# Patient Record
Sex: Male | Born: 1955
Health system: Southern US, Community
[De-identification: ages and names within clinical notes are randomized; demographics above are authoritative.]

## PROBLEM LIST (undated history)

## (undated) DIAGNOSIS — M1990 Unspecified osteoarthritis, unspecified site: Secondary | ICD-10-CM

## (undated) DIAGNOSIS — J189 Pneumonia, unspecified organism: Secondary | ICD-10-CM

## (undated) DIAGNOSIS — F32A Depression, unspecified: Secondary | ICD-10-CM

## (undated) DIAGNOSIS — I739 Peripheral vascular disease, unspecified: Secondary | ICD-10-CM

## (undated) DIAGNOSIS — C801 Malignant (primary) neoplasm, unspecified: Secondary | ICD-10-CM

## (undated) DIAGNOSIS — J449 Chronic obstructive pulmonary disease, unspecified: Secondary | ICD-10-CM

## (undated) DIAGNOSIS — F329 Major depressive disorder, single episode, unspecified: Secondary | ICD-10-CM

## (undated) HISTORY — PX: KNEE SURGERY: SHX244

## (undated) HISTORY — PX: TONSILLECTOMY: SUR1361

---
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
## 2000-02-05 ENCOUNTER — Ambulatory Visit (HOSPITAL_BASED_OUTPATIENT_CLINIC_OR_DEPARTMENT_OTHER): Admission: RE | Admit: 2000-02-05 | Discharge: 2000-02-05 | Payer: Self-pay | Admitting: Orthopedic Surgery

## 2004-12-17 ENCOUNTER — Ambulatory Visit (HOSPITAL_COMMUNITY): Admission: RE | Admit: 2004-12-17 | Discharge: 2004-12-17 | Payer: Self-pay | Admitting: Family Medicine

## 2006-04-29 ENCOUNTER — Emergency Department (HOSPITAL_COMMUNITY): Admission: EM | Admit: 2006-04-29 | Discharge: 2006-04-30 | Payer: Self-pay | Admitting: Emergency Medicine

## 2007-03-29 ENCOUNTER — Ambulatory Visit: Payer: Self-pay | Admitting: Orthopedic Surgery

## 2007-03-30 ENCOUNTER — Encounter (HOSPITAL_COMMUNITY): Admission: RE | Admit: 2007-03-30 | Discharge: 2007-04-29 | Payer: Self-pay | Admitting: Orthopedic Surgery

## 2007-03-30 ENCOUNTER — Encounter: Payer: Self-pay | Admitting: Orthopedic Surgery

## 2007-05-27 ENCOUNTER — Ambulatory Visit: Payer: Self-pay | Admitting: Orthopedic Surgery

## 2008-08-14 ENCOUNTER — Encounter: Payer: Self-pay | Admitting: Orthopedic Surgery

## 2008-08-14 ENCOUNTER — Ambulatory Visit (HOSPITAL_COMMUNITY): Admission: RE | Admit: 2008-08-14 | Discharge: 2008-08-14 | Payer: Self-pay | Admitting: General Surgery

## 2008-08-23 ENCOUNTER — Encounter (INDEPENDENT_AMBULATORY_CARE_PROVIDER_SITE_OTHER): Payer: Self-pay | Admitting: *Deleted

## 2008-08-23 ENCOUNTER — Ambulatory Visit: Payer: Self-pay | Admitting: Orthopedic Surgery

## 2008-08-23 DIAGNOSIS — M23302 Other meniscus derangements, unspecified lateral meniscus, unspecified knee: Secondary | ICD-10-CM | POA: Insufficient documentation

## 2008-08-24 ENCOUNTER — Encounter: Payer: Self-pay | Admitting: Orthopedic Surgery

## 2008-08-25 ENCOUNTER — Ambulatory Visit: Payer: Self-pay | Admitting: Orthopedic Surgery

## 2008-08-25 ENCOUNTER — Ambulatory Visit (HOSPITAL_COMMUNITY): Admission: RE | Admit: 2008-08-25 | Discharge: 2008-08-25 | Payer: Self-pay | Admitting: Orthopedic Surgery

## 2008-08-29 ENCOUNTER — Ambulatory Visit: Payer: Self-pay | Admitting: Orthopedic Surgery

## 2008-09-07 ENCOUNTER — Ambulatory Visit: Payer: Self-pay | Admitting: Orthopedic Surgery

## 2008-09-14 ENCOUNTER — Ambulatory Visit: Payer: Self-pay | Admitting: Orthopedic Surgery

## 2008-10-12 ENCOUNTER — Ambulatory Visit: Payer: Self-pay | Admitting: Orthopedic Surgery

## 2008-10-24 ENCOUNTER — Ambulatory Visit: Payer: Self-pay | Admitting: Orthopedic Surgery

## 2008-10-24 DIAGNOSIS — M1710 Unilateral primary osteoarthritis, unspecified knee: Secondary | ICD-10-CM

## 2008-10-24 DIAGNOSIS — M2240 Chondromalacia patellae, unspecified knee: Secondary | ICD-10-CM | POA: Insufficient documentation

## 2008-10-24 DIAGNOSIS — IMO0002 Reserved for concepts with insufficient information to code with codable children: Secondary | ICD-10-CM | POA: Insufficient documentation

## 2008-11-27 ENCOUNTER — Ambulatory Visit: Payer: Self-pay | Admitting: Orthopedic Surgery

## 2010-02-14 NOTE — Letter (Signed)
Summary: Generic Letter  [facility]  [address]  [place], [place] [address]   Phone: [phone]  Fax: [phone]    08/24/2008  Kenard Gower [address] [place], [place]  [address]  Visit Type:  Follow-up, new problem   CC:  left knee pain.  History of Present Illness: I saw Nettie Fredricksen in the office today for a followup visit.  He is a 55 years old man with the complaint of:  NEW PROBLEM. LEFT KNEE PAIN.  MRI at [facility] on 08-14-08. Shows radial tear posterior horn medial meniscus. Mild patellofemoral and medial compartment osteoarthritis and leaking, dissecting Baker's cyst.  Patient states he does not know if he injured hisself. He states that it has been hurting about 8 weeks, but has gotten bad the past 2 1/2 weeks. He has had alot of swelling. He says that it has been bearable the past 2 days. He has been using Ibuprofen and ice.  He is fine as long as he keep the leg straight   Current Medications (verified): 1)  None  Allergies: 1)  ! 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  Past Medical History: Reviewed history from 03/29/2007 and no changes required. none  Past Surgical History: Reviewed history from 03/29/2007 and no changes required. tonsils scoped knee right knee 1999  Family History: Reviewed history from 03/29/2007 and no changes required. Hx, family, chronic respiratory condition  Social History: Reviewed history from 03/29/2007 and no changes required. Patient is separated.  Armed forces operational officer  Review of Systems  The  review of systems is negative for General, Cardiac , Resp, GI, GU, Neuro, MS, Endo, Psych, Derm, EENT, Immunology, and Lymphatic.  Physical Exam  Msk:  The patient is well developed and nourished, with normal grooming and hygiene. The body habitus is  medium Pulses:  The pulses and perfusion were normal with normal color, temperature  and no swelling  Extremities:  right knee normal alignment, ROM, strength, stability  left knee alignment normal ROM 0-95 medial joint line tenderness   small joint effusion, medial joint line tenderness   positive McMurrays   stability is normal    The upper extremities have normal appearance, ROM, strength and stability.   Neurologic:  The coordination and sensation were normal  The reflexes were normal   Skin:  intact without lesions or rashes Inguinal Nodes:  no significant adenopathy Psych:  alert and cooperative; normal mood and affect; normal attention span and concentration   Impression & Recommendations:  Problem # 1:  DERANGEMENT MENISCUS (ICD-717.5) Assessment New  Orders: Est. Patient Level IV (96295) Knee x-ray,  3 views (28413) normal joint lines  IMPR: normal knee   Patient Instructions: 1)  DOS 08/25/2008 2)  PREOP 08/23/2008 1215 take packet with you 3)  POSTOP 08/29/2008 4)    5)  Informed consent process: I have discussed the procedure with the patient. I have answered their questions. The risks of bleeding, infection, nerve and vascualr injury have been discussed. The diagnosis and reason for surgery have been explained. The patient demonstrates understanding of this discussion. Specific to this procedure risks include:  6)  pain,  swelling, stiffness, retear 7)  SURGERY: SALK MEDIAL MENISECTOMY     Fuller Canada MD

## 2010-02-14 NOTE — Assessment & Plan Note (Signed)
Summary: POST OP 1/LT KNEE ARTH 08/25/08/[facility]   Visit Type:  Follow-up  CC:  post surgical followup.  History of Present Illness: I saw James Harris back in the office today.  He is now post op 1 arthroscopy left knee with partial medial menisectomy.  DOS 08/25/08.  POD 4.  Vicodin es number 42, 2 refills given at discharge.  Pain med helps.  patient prefers to do on self-directed therapy due to cost.      Allergies: 1)  ! * Relfaen  Physical Exam  Additional Exam:  the knee looks good the portals were closed the flexion arc is 90 weak extension   Impression & Recommendations:  Problem # 1:  DERANGEMENT MENISCUS (ICD-717.5)  postop arthroscopy LEFT knee home directed program via pep pad  Orders: Post-Op Check (04540)  Patient Instructions: 1)  26th Aug Check knee

## 2010-02-14 NOTE — Letter (Signed)
Summary: Surgery order LT knee  Surgery order LT knee   Imported By: Cammie Sickle 08/24/2008 11:05:19  _____________________________________________________________________  External Attachment:    Type:   Image     Comment:   External Document

## 2010-02-14 NOTE — Assessment & Plan Note (Signed)
Summary: RT KNEE PAIN   Vital Signs:  Patient Profile:   55 Years Old Male Weight:      220 pounds Pulse rate:   78 / minute Resp:     16 per minute  Vitals Entered By: Ether Griffins (March 29, 2007 9:13 AM)                 Chief Complaint:  right knee pain.  History of Present Illness: I saw James Harris in the office today for an initial visit.  He is a 55 years old man with the complaint of:  chronic right knee pain.  He has been having pain for 10 years since he had a knee scope, Dr. Turner Daniels in [place] did surg for torn meniscus.  He does not have any swelling.  His pain is at knee cap on right knee. He has sharp pain and achy pain. Going up and down steps and squatting hurts him alot.  No radiation of pain.  Pain level today is around 3. He does have stiffness after sitting long periods, no recent xrays.  Ibuprofen helps somewhat.      Current Allergies: No known allergies   Past Medical History:    none  Past Surgical History:    tonsils    scoped knee right knee 1999   Family History:    Hx, family, chronic respiratory condition  Social History:    Patient is separated.     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      Denies weight loss, weight gain, fever, and chills.  Cardiac      Denies chest pain, angina, heart attack, heart failure, poor circulation, blood clots, and phlebitis.  Resp      Denies short of breath, difficulty breathing, COPD, cough, and pneumonia.  GI      Complains of constipation.      Denies nausea, vomiting, diarrhea, difficulty swallowing, ulcers, GERD, and reflux.  GU      Denies kidney failure, kidney transplant, kidney stones, burning, poor stream, testicular cancer, blood in urine, and .  Neuro      Denies headache, dizziness, migraines, numbness, weakness, tremor, and unsteady  walking.  MS      Complains of joint pain.      Denies rheumatoid arthritis, joint swelling, gout, bone cancer, osteoporosis, and .  Endo      Denies thyroid disease, goiter, and diabetes.  Psych      Denies depression, mood swings, anxiety, panic attack, bipolar, and schizophrenia.  Derm      Complains of eczema.      Denies cancer and itching.  EENT      Denies poor vision, cataracts, glaucoma, poor hearing, vertigo, ears ringing, sinusitis, hoarseness, toothaches, and bleeding gums.  Immunology      Denies seasonal allergies, sinus problems, and allergic to bee stings.  Lymphatic      Denies lymph node cancer and lymph edema.   Physical Exam  Skin:     intact without lesions or rashes Psych:     alert and cooperative; normal mood and affect; normal attention span and concentration   Knee Exam  General:    Well-developed, well-nourished, normal body habitus; no deformities, normal grooming.  Gait:    Normal heel-toe gait pattern bilaterally.  Skin:    Intact, no scars, lesions, rashes, cafe au lait spots, or bruising.    Inspection:     No deformity, ecchymosis or swelling.   Palpation:    inferior patella right knee   Vascular:    There was no swelling or varicose veins. The pulses and temperature are normal. There was no edema or tenderness.  Sensory:    Gross coordination and sensation were normal.    Motor:    Motor strength 5/5 bilaterally for quadriceps, hamstrings, ankle dorsiflexion, and ankle plantar flexion.    Reflexes:    Normal and symmetric patellar and Achilles reflexes bilaterally.    Knee Exam:    Right:    Inspection:  Abnormal    Palpation:  Abnormal    Stability:  stable    Tenderness:       Swelling:  no    Erythema:  no    Range of Motion:       Flexion-Active: full       Extension-Active: full       Flexion-Passive: full       Extension-Passive: full    Left:    Inspection:  Normal    Palpation:  Normal     Stability:  stable    Tenderness:  no    Swelling:  no    Erythema:  no    Range of Motion:       Flexion-Active: full       Extension-Active: full       Flexion-Passive: full       Extension-Passive: full  Anterior drawer:    Right negative; Left negative Posterior drawer:    Right negative; Left negative Lachman :    Right negative; Left negative  normal McMurrays test     Impression & Recommendations:  Problem # 1:  * PATELLA TENDONITIS  Multiple views of the right knee  Findings: enthesiophyte superior patella and anteroinferior patella  IMP:c/w enthesopathy  Physical Therapy Patella brace  Relafen 500 two times a day    Medications Added to Medication List This Visit: 1)  Nabumetone 500 Mg Tabs (Nabumetone) .Marland Kitchen.. 1 by mouth two times a day  Other Orders: Physical Therapy Referral (PT) New Patient Level III (04540) Knee x-ray,  3 views (98119)   Patient Instructions: 1)  Please schedule a follow-up appointment in 2 months. 2)  check right knee    Prescriptions: NABUMETONE 500 MG  TABS (NABUMETONE) 1 by mouth two times a day  #90 x 1   Entered and Authorized by:   Fuller Canada MD   Signed by:   Fuller Canada MD on 03/29/2007   Method used:   Print then Give to Patient   RxID:   [id number]  ]

## 2010-02-14 NOTE — Letter (Signed)
Summary: Out of Work  [facility]  [address]  [place], [place] [address]   Phone: [phone]  Fax: [phone]    August 23, 2008   Employee:  KANYON SEIBOLD    To Whom It May Concern:   For Medical reasons, please excuse the above named employee from work for the following dates:  Start:   August 23, 2008 (appointment in our office today)  End/Return to work:  August 24, 2008   ______________________________________________________________________________  Out of work:   August 25, 2008, secondary to surgery  Approximate return to work date:  September 19, 2008   If you need additional information, please feel free to contact our office.         Sincerely,    Terrance Mass, MD

## 2010-02-14 NOTE — Assessment & Plan Note (Signed)
Summary: POST OP 2/RE-CK LT KNEE/ARTHR SURG 08/25/08/[facility]   Visit Type:  Follow-up, postop  CC:  stiffness LEFT knee.  History of Present Illness: I saw James Harris back in the office today.  He is now post op 2 left knee.   arthroscopy left knee with partial medial menisectomy.  DOS 08/25/08.  POD 13   Self directed PT.  Has been walking and doing exercises, hurts to go down a slope.  Some swelling, not bad after walking.  Uses ice.  No meds for pain.  Doing good.   Allergies: 1)  ! * Relfaen  Physical Exam  Additional Exam:  flexion 100 extension -5 no effusion portals are intact no drainage   Impression & Recommendations:  Problem # 1:  DERANGEMENT MENISCUS (ICD-717.5)  improved continue therapy reassess one week to determine work status and then  Orders: Post-Op Check (16109)  Patient Instructions: 1)  f/u next thursday  2)  CONTINUE HOME THERAPY

## 2010-02-14 NOTE — Assessment & Plan Note (Signed)
Summary: 4 WK RE-CK LT KNEE/POST OP 08/25/08/[facility]   Visit Type:  postop visit  CC:  checked LEFT knee.  History of Present Illness: I saw James Harris in the office today for a 4 WEEK  followup visit.  He is a 55 years old man with the complaint of:  LEFT KNEE.  DOS   08-25-08.  (90 days ends  mid November)  Procedure   Arthroscopy left knee partial medial menisectomy.  Medication   Ibuprofen. 800mg  as needed   Subjectives   Patient states that his knee is doing better.  Allergies: 1)  ! * Relfaen   Knee Exam  Knee Exam:    Left:    Inspection:  Normal    Palpation:  Normal    Stability:  stable    Tenderness:  no    Swelling:  no    knee looks good today range of motion approximate 125 of full extension no joint effusion good strength in his quadriceps.   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

## 2010-02-14 NOTE — Assessment & Plan Note (Signed)
Summary: left knee pain./bringing mri /uhc/bsf   Visit Type:  Follow-upcomminuted problem  CC:  left knee pain.  History of Present Illness: I saw James Harris in the office today for a followup visit.  He is a 55 years old man with the complaint of:  NEW PROBLEM. LEFT KNEE PAIN.  MRI at [facility] on 08-14-08. Shows radial tear posterior horn medial meniscus. Mild patellofemoral and medial compartment osteoarthritis and leaking, dissecting Baker's cyst.  Patient states he does not know if he injured hisself. He states that it has been hurting about 8 weeks, but has gotten bad the past 2 1/2 weeks. He has had alot of swelling. He says that it has been bearable the past 2 days. He has been using Ibuprofen and ice.  He is fine as long as he keep the leg straight   Current Medications (verified): 1)  None  Allergies: 1)  ! 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  Past Medical History: Reviewed history from 03/29/2007 and no changes required. none  Past Surgical History: Reviewed history from 03/29/2007 and no changes required. tonsils scoped knee right knee 1999  Family History: Reviewed history from 03/29/2007 and no changes required. Hx, family, chronic respiratory condition  Social History: Reviewed history from 03/29/2007 and no changes required. Patient is separated.  Armed forces operational officer  Review of Systems  The review of systems is negative for General, Cardiac , Resp, GI, GU, Neuro, MS, Endo, Psych, Derm, EENT, Immunology, and Lymphatic.  Physical Exam  Msk:  The patient is well  developed and nourished, with normal grooming and hygiene. The body habitus is  medium Pulses:  The pulses and perfusion were normal with normal color, temperature  and no swelling  Extremities:  right knee normal alignment, ROM, strength, stability  left knee alignment normal ROM 0-95 medial joint line tenderness   small joint effusion, medial joint line tenderness   positive McMurrays   stability is normal    The upper extremities have normal appearance, ROM, strength and stability.   Neurologic:  The coordination and sensation were normal  The reflexes were normal   Skin:  intact without lesions or rashes Inguinal Nodes:  no significant adenopathy Psych:  alert and cooperative; normal mood and affect; normal attention span and concentration   Impression & Recommendations:  Problem # 1:  DERANGEMENT MENISCUS (ICD-717.5) Assessment New  Orders: Est. Patient Level IV (91478) Knee x-ray,  3 views (29562) normal joint lines  IMPR: normal knee   Patient Instructions: 1)  DOS 08/25/2008 2)  PREOP 08/23/2008 1215 take packet with you 3)  POSTOP 08/29/2008 4)    5)  Informed consent process: I have discussed the procedure with the patient. I have answered their questions. The risks of bleeding, infection, nerve and vascualr injury have been discussed. The diagnosis and reason for surgery have been explained. The patient demonstrates understanding of this discussion. Specific to this procedure risks include:  6)  pain, swelling, stiffness, retear 7)  SURGERY: SALK MEDIAL MENISECTOMY

## 2010-02-14 NOTE — Assessment & Plan Note (Signed)
Summary: 2 M RE-CHECK RT KNEE FOL'G PT/[facility]    History of Present Illness: I saw Sonnie Deshazer in the office today for a 2 month followup visit.  He is a 55 years old man with the complaint of:      right knee pain.  Patient states his knee is better.  pertinent problem related review of systems. He did have some GI upset with dyspepsia from Relafen but it did work. He can take ibuprofen under 600 mg without problems.  He indicates he is working without any symptoms at this time other than an occasional ache which wants to the knee.     Updated Prior Medication List: NABUMETONE 500 MG  TABS (NABUMETONE) 1 by mouth two times a day  Current Allergies: ! * RELFAEN      Physical Exam  GENERAL: Normal appearance CDV: normal temp, color and pulses PSYCHE: Awake and alert NEURO: normal sensation  MSK:  examination of the knee shows full range of motion no pain tenderness negative McMurray's stable ligaments motor exam normal.    Impression & Recommendations:  Problem # 1:  * PATELLA TENDONITIS Assessment: Improved  Other Orders: Est. Patient Level III (62130)   Patient Instructions: 1)  Ibuprofen under 600mg  or tylenol arthritis for pain  2)  return if further problems    ]

## 2010-02-14 NOTE — Assessment & Plan Note (Signed)
Summary: RE-CK LT KNEE/POST OP SURG 08/25/08/[facility]   Visit Type:  Follow-up  CC:  postoperative check.  History of Present Illness: I saw James Harris in the office today for a 1 WEEK  followup visit.  He is a 54 years old man with the complaint of:  LEFT KNEE.  DOS   08-25-08.  Procedure   Arthroscopy left knee partial medial menisectomy.  Medication   Ibuprofen. 800mg  as needed   Subjectives   Patient states that his knee is bothering him at work. He is having stiffness.  He seems to have trouble when he is active with the knee seems to get better with rest seems to be primarily patellofemoral which matches his arthroscopic findings of chondromalacia patella and trochlea.    Allergies: 1)  ! * Relfaen  Physical Exam  Additional Exam:  he does have full extension but has limited flexion at 120 and his knee is tight at that point mediolateral joint lines are nontender he does have patellofemoral pain and crepitance.  His strength seemed to be good in extension   Impression & Recommendations:  Problem # 1:  DERANGEMENT MENISCUS (ICD-717.5)  Orders: Post-Op Check (16109)  Problem # 2:  CHONDROMALACIA PATELLA (ICD-717.7)  Orders: Post-Op Check (60454)  Problem # 3:  KNEE, ARTHRITIS, DEGEN./OSTEO (ICD-715.96)  Assessment: Patellofemoral arthritis seems to be bothering him he is probably a good candidate for Synvisc injections if he doesn't improve over the next 4 weeks  Orders: Post-Op Check (09811)  Patient Instructions: 1)  4 weeks  2)  [recheck-knee]

## 2010-02-14 NOTE — Assessment & Plan Note (Signed)
Summary: 4 WK RE-CK LT KNEE/POST OP SURG 08/25/08/[facility]   Visit Type:  Follow-up, post op   CC:  left knee pain.  History of Present Illness: I saw James Harris in the office today for a followup visit.  He is a 55 years old man with the complaint of:  LEFT KNEE.  DOS   08-25-08.  Procedure   Arthroscopy left knee partial medial menisectomy.  Medication   Ibuprofen.  Subjectives   Patient states that the has had a lot of pain this week. seems like its something new       Allergies: 1)  ! * Relfaen   Knee Exam  General:    Well-developed, well-nourished, normal body habitus; no deformities, normal grooming.  Gait:    Normal heel-toe gait pattern    Skin:    Intact, no scars, lesions, rashes, cafe au lait spots, or bruising.    Vascular:    There was no swelling or varicose veins. The pulses and temperature are normal. There was no edema or tenderness.  Sensory:    Gross coordination and sensation were normal.    Motor:    Motor strength 5/5 bilaterally for quadriceps, hamstrings, ankle dorsiflexion, and ankle plantar flexion.    Knee Exam:    Left:    Inspection:  Abnormal    Palpation:  Abnormal       Location:  patellar    Stability:  stable    Tenderness:  patellar    Swelling:  diffuse, but no effusion     Range of Motion:       Flexion-Active: 120 degrees   Impression & Recommendations:  Problem # 1:  DERANGEMENT MENISCUS (ICD-717.5)  Orders: Post-Op Check (16109)  Patient Instructions: 1)  Stay off the knee  2)  wear the brace on the knee  3)  return in 1 week  4)  Take vicodin over the next 2 -3 days

## 2010-02-14 NOTE — Assessment & Plan Note (Signed)
Summary: 1 WK RE-CK LT KNEE/POST OP ARTHR SURG 08/25/08/[facility]   Visit Type:  Follow-up, post op   CC:  stiffness and soreness .  History of Present Illness: I saw James Harris in the office today for a followup visit.  He is a 55 years old James Harris with the complaint of:  LEFT KNEE.  DOS   08-25-08. Procedure   Arthroscopy left knee partial medial menisectomy. Medication: Phenergan and Vicodin  Subjectives:   Patient states he still has some stiffness and soreness.  Allergies: 1)  ! * Relfaen  Physical Exam  Additional Exam:  mild joint effusion   flexion 120 vs 135 opposite knee   full extension   no extensor lag    Impression & Recommendations:  Problem # 1:  DERANGEMENT MENISCUS (ICD-717.5) Assessment Improved sleeve with hinges [plastic] Orders: Post-Op Check (04540)  Patient Instructions: 1)  Please schedule a follow-up appointment in 1 month. 2)  give a RTW NOTE

## 2010-02-14 NOTE — Miscellaneous (Signed)
Summary: Rehab Report  Rehab Report   Imported By: Elvera Maria 06/01/2007 12:27:58  _____________________________________________________________________  External Attachment:    Type:   Image     Comment:   evaluation

## 2010-04-08 ENCOUNTER — Ambulatory Visit (HOSPITAL_COMMUNITY)
Admission: RE | Admit: 2010-04-08 | Discharge: 2010-04-08 | Disposition: A | Discharge status: Home / Self Care | Payer: 59 | Source: Ambulatory Visit | Attending: Internal Medicine | Admitting: Internal Medicine

## 2010-04-08 ENCOUNTER — Other Ambulatory Visit (HOSPITAL_COMMUNITY): Payer: Self-pay | Admitting: Internal Medicine

## 2010-04-08 DIAGNOSIS — R05 Cough: Secondary | ICD-10-CM

## 2010-04-08 DIAGNOSIS — R059 Cough, unspecified: Secondary | ICD-10-CM

## 2010-04-08 DIAGNOSIS — R0602 Shortness of breath: Secondary | ICD-10-CM | POA: Insufficient documentation

## 2010-04-08 DIAGNOSIS — R509 Fever, unspecified: Secondary | ICD-10-CM | POA: Insufficient documentation

## 2010-04-20 LAB — BASIC METABOLIC PANEL
BUN: 14 mg/dL (ref 6–23)
CO2: 28 mEq/L (ref 19–32)
Calcium: 9.2 mg/dL (ref 8.4–10.5)
Chloride: 108 mEq/L (ref 96–112)
Creatinine, Ser: 0.99 mg/dL (ref 0.4–1.5)
GFR calc Af Amer: >60 mL/min (ref >60)
GFR calc non Af Amer: >60 mL/min (ref >60)
Glucose, Bld: 105 mg/dL — ABNORMAL HIGH (ref 70–99)
Potassium: 4.2 mEq/L (ref 3.5–5.1)
Sodium: 139 mEq/L (ref 135–145)

## 2010-04-20 LAB — CBC
HCT: 46.2 % (ref 39.0–52.0)
Hemoglobin: 16.2 g/dL (ref 13.0–17.0)
MCHC: 35.1 g/dL (ref 30.0–36.0)
MCV: 92.6 fL (ref 78.0–100.0)
Platelets: 205 10*3/uL (ref 150–400)
RBC: 4.99 MIL/uL (ref 4.22–5.81)
RDW: 13.2 % (ref 11.5–15.5)
WBC: 5.4 10*3/uL (ref 4.0–10.5)

## 2010-05-28 NOTE — Op Note (Signed)
NAMEREMIEL, CORTI NO.:  [id number]   MEDICAL RECORD NO.:  [id number]          PATIENT TYPE:  AMB   LOCATION:  DAY                           FACILITY:  [facility]   PHYSICIAN:  Vickki Hearing, M.D.DATE OF BIRTH:  03/11/1955   DATE OF PROCEDURE:  08/25/2008  DATE OF DISCHARGE:                               OPERATIVE REPORT   PREOPERATIVE DIAGNOSIS:  Torn medial meniscus, left knee.   POSTOPERATIVE DIAGNOSIS:  Torn medial meniscus, left knee.   PROCEDURE:  Arthroscopy, left knee, partial medial meniscectomy.   SURGEON:  Vickki Hearing, MD   ASSISTANTS:  None.   ANESTHESIA:  General.   FINDINGS:  Posterior horn tear, medial meniscus; grade 1 chondromalacia,  medial patella and trochlea; lateral compartment normal; ACL, PCL  intact.   SPECIMENS:  None.   HISTORY:  This is a 55 year old male was referred to me by Dr. Malvin Johns  for left knee pain.  He had pain for approximately 8 weeks and got worse  in the last 3 weeks, the swelling increased, he had trouble walking.  An  MRI was obtained on August 14, 2008, it showed a radial tear posterior  horn medial meniscus with a Baker cyst, some mild patellofemoral and  medial compartment arthritis.  The patient noted that he could walk  straight fine, but the minute he twists, he had severe pain in his left  knee, I offered him surgical versus nonsurgical treatment, he opted for  surgical treatment.   I was first identified him and marked his left knee as the surgical site  and countersigned his mark.  This was done in the preop area.  I  reviewed his history and physical updated and signed all this forms and  we took him to surgery.  In the surgical suite, he was given general  anesthesia.  He was prepped with chlorhexidine and sterile draping in  the supine position with an arthroscopic left leg holder and padded  right leg holder.  After time-out was completed, a lateral portal was  made, the scope was  introduced through the lateral compartment into the  medial compartment.  A diagnostic arthroscopy was performed.  It was  repeated using a probe.  The torn medial meniscus was noted in the  posterior portion with a radial tear as noted on MRI.  The  chondromalacia seen was also noted in the trochlear region and the  medial patellar region and somewhat on the tibial plateau.   We used a straight Duckbill Forceps, a motorized shaver, and a 50-degree  ArthroCare wand to resect and balance to meniscal tear to a stable rim,  which was confirmed visually and with a probe.   A brief debridement of the medial facet of patella was performed and the  knee was irrigated and closed with Steri-Strips, injected with Marcaine,  and the patient was taken to recovery room in stable condition.  He was  discharged home with Vicodin ES #42 with 2 refills and Phenergan 1 q.4  p.r.n. nausea #25 mg with a total of 30 tablets, no refills, follow up  on Tuesday, therapy to start on Wednesday.      Vickki Hearing, M.D.  Electronically Signed     SEH/MEDQ  D:  08/25/2008  T:  08/25/2008  Job:  [id number]

## 2010-05-31 NOTE — Op Note (Signed)
[facility]  Patient:    James Harris, James Harris                       MRN: [id number] Proc. Date: 02/05/00 Attending:  Alinda Deem, M.D.                           Operative Report  PREOPERATIVE DIAGNOSIS:  Right knee medial meniscal tear.  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  PROCEDURE:  Right knee partial arthroscopic removal of parrot-beak medial meniscal tear and debridement of chondromalacia.  SURGEON:  Alinda Deem, M.D.  ANESTHESIA:  Local with IV sedation.  ESTIMATED BLOOD LOSS:  Minimal.  FLUID REPLACEMENT:  800 cc crystalloid.  TOURNIQUET TIME:  None.  INDICATIONS FOR PROCEDURE:  A 55 year old man who has been followed for many months with medial joint line pain of the right knee and has failed conservative treatment, anti-inflammatory medicines, activity modification, and physical therapy.  He has persistent medial joint line pain and now desires elective arthroscopic evaluation and treatment of his knee with presumed medial meniscal tear.  DESCRIPTION OF PROCEDURE:  The patient identified by arm band, taken to the operating room at [facility], appropriate anesthetic monitors were attached and local anesthesia with IV sedation induced with the patient in supine position.  A lateral post was applied to the table, and the right lower extremity prepped and draped in the usual sterile fashion from the ankle to the mid-thigh.  Standard inferomedial and inferolateral parapatellar portals were then made with a 11 blade, allowing introduction of the arthroscope through the inferolateral portal and the outflow through the inferomedial portal.  The suprapatellar pouch, patella, and trochlea are in excellent condition.  I identified the medial parrot-beak tear  of the posteromedial horn of the medial meniscus, and this was debrided using a 3.5 Gator sucker-shaver, straight-biting forceps, and a 4.2 mm [employer].  Grade 3 chondromalacia, global, of the medial tibial condyle, was also identified and debrided.  The ACL and the PCL were intact.  On the lateral side, the lateral femoral condyle and lateral meniscus were in excellent condition.  The lateral tibial condyle had grade 3 chondromalacia, which required debridement with a 4.2 mm [employer].  The gutters were cleared.  The scope was taken medial and lateral to the PCL, clearing the posterior compartment.  The knee was then washed out with normal saline solution and the arthroscopic instruments removed.  A dressing of Xeroform, 4 x 8 dressing sponges, Webril, and an Ace wrap applied.  The patient was awakened and taken to the recovery room without difficulty. DD:  02/05/00 TD:  02/05/00 Job: [id number]

## 2010-10-25 ENCOUNTER — Other Ambulatory Visit: Payer: Self-pay

## 2010-10-25 ENCOUNTER — Emergency Department (HOSPITAL_COMMUNITY)
Admission: EM | Admit: 2010-10-25 | Discharge: 2010-10-25 | Disposition: A | Discharge status: Home / Self Care | Payer: Worker's Compensation | Attending: Emergency Medicine | Admitting: Emergency Medicine

## 2010-10-25 ENCOUNTER — Encounter: Payer: Self-pay | Admitting: *Deleted

## 2010-10-25 DIAGNOSIS — R05 Cough: Secondary | ICD-10-CM | POA: Insufficient documentation

## 2010-10-25 DIAGNOSIS — R1311 Dysphagia, oral phase: Secondary | ICD-10-CM | POA: Insufficient documentation

## 2010-10-25 DIAGNOSIS — Z87891 Personal history of nicotine dependence: Secondary | ICD-10-CM | POA: Insufficient documentation

## 2010-10-25 DIAGNOSIS — Z77098 Contact with and (suspected) exposure to other hazardous, chiefly nonmedicinal, chemicals: Secondary | ICD-10-CM | POA: Insufficient documentation

## 2010-10-25 DIAGNOSIS — R059 Cough, unspecified: Secondary | ICD-10-CM | POA: Insufficient documentation

## 2010-10-25 DIAGNOSIS — T1490XA Injury, unspecified, initial encounter: Secondary | ICD-10-CM

## 2010-10-25 DIAGNOSIS — M542 Cervicalgia: Secondary | ICD-10-CM | POA: Insufficient documentation

## 2010-10-25 DIAGNOSIS — R070 Pain in throat: Secondary | ICD-10-CM | POA: Insufficient documentation

## 2010-10-25 LAB — POCT I-STAT TROPONIN I: Troponin i, poc: 0 ng/mL (ref 0.00–0.08)

## 2010-10-25 MED ORDER — LIDOCAINE HCL (PF) 2 % IJ SOLN
3.0000 mL | Freq: Once | INTRAMUSCULAR | Status: AC
  Administered 2010-10-25: 3 mL
  Filled 2010-10-25: qty 10

## 2010-10-25 MED ORDER — METHYLPREDNISOLONE SODIUM SUCC 125 MG IJ SOLR
125.0000 mg | Freq: Once | INTRAMUSCULAR | Status: AC
  Administered 2010-10-25: 125 mg via INTRAVENOUS
  Filled 2010-10-25: qty 2

## 2010-10-25 MED ORDER — PREDNISONE 20 MG PO TABS
60.0000 mg | ORAL_TABLET | Freq: Every day | ORAL | Status: DC
  Administered 2010-10-25: 60 mg via ORAL
  Filled 2010-10-25: qty 3

## 2010-10-25 MED ORDER — DIPHENHYDRAMINE HCL 50 MG/ML IJ SOLN
50.0000 mg | Freq: Once | INTRAMUSCULAR | Status: AC
  Administered 2010-10-25: 50 mg via INTRAVENOUS
  Filled 2010-10-25: qty 1

## 2010-10-25 NOTE — ED Notes (Signed)
Pt was at work and exposed to ethylhexylamine and has been having difficulty swallowing and moving his neck; pt brought MSDS papers

## 2010-10-25 NOTE — ED Notes (Signed)
Pt placed on [place] O2 at 2 LPM secondary to c/o increasing tightness in chest and throat after chemical exposure.  Pt's oxygen saturation >96 %.  No distress noted at this time.

## 2010-10-25 NOTE — ED Provider Notes (Signed)
History     CSN: [id number] Arrival date & time: 10/25/2010 12:33 PM  Chief Complaint  Patient presents with  . Dysphagia    (Consider location/radiation/quality/duration/timing/severity/associated sxs/prior treatment) HPI This 55 year old male had a negative cardiac stress test last week. He worked today he had an inhalational exposure to a chemical causing upper airway irritation with dysphasia, and slight cough with throat tightness and slight swelling to his neck. There is no stridor and no drooling he does prefer to spit out his secretions rather than swallowing due to the pain. He has no shortness of breath, no chest pain, no dizziness, no altered mental status, no vomiting, or other concerns. He is exposure 2 hours prior to arrival had gradual onset of the symptoms. There is no radiation of this pain other than in his throat. It is a burning tender sore feeling. He has no associated symptoms. This is been a constant feeling for over 2 hours now. History reviewed. No pertinent past medical history.  Past Surgical History  Procedure Date  . Knee surgery bilateral    History reviewed. No pertinent family history.  History  Substance Use Topics  . Smoking status: Former Games developer  . Smokeless tobacco: Not on file  . Alcohol Use: Yes     rarely      Review of Systems  Constitutional: Negative for fever.       10 Systems reviewed and are negative for acute change except as noted in the HPI.  HENT: Positive for sore throat, trouble swallowing and neck pain. Negative for congestion, rhinorrhea, sneezing and drooling.   Eyes: Negative for discharge and redness.  Respiratory: Positive for cough. Negative for choking, chest tightness and shortness of breath.   Cardiovascular: Negative for chest pain.  Gastrointestinal: Negative for vomiting and abdominal pain.  Musculoskeletal: Negative for back pain.  Skin: Negative for rash.  Neurological: Negative for syncope, numbness and  headaches.  Psychiatric/Behavioral:       No behavior change.    Allergies  Review of patient's allergies indicates no known allergies.  Home Medications   Current Outpatient Rx  Name Route Sig Dispense Refill  . ASPIRIN 81 MG PO CHEW Oral Chew 81 mg by mouth daily.      . IBUPROFEN 200 MG PO TABS Oral Take 400 mg by mouth every 8 (eight) hours as needed. Knee inflammation       BP 142/86  Pulse 81  Temp(Src) 98.2 F (36.8 C) (Oral)  Resp 20  Ht 6' (1.829 m)  Wt 220 lb (99.791 kg)  BMI 29.84 kg/m2  SpO2 97%  Physical Exam  Nursing note and vitals reviewed. Constitutional:       Awake, alert, nontoxic appearance.  HENT:  Head: Atraumatic.  Mouth/Throat: Oropharynx is clear and moist. No oropharyngeal exudate.  Eyes: Conjunctivae are normal. Pupils are equal, round, and reactive to light. Right eye exhibits no discharge. Left eye exhibits no discharge.  Neck: Normal range of motion. Neck supple.       Mild diffuse anterior tenderness with minimal swelling noted without erythema or induration  Cardiovascular: Normal rate and regular rhythm.   No murmur heard. Pulmonary/Chest: Effort normal and breath sounds normal. No respiratory distress. He has no wheezes. He has no rales. He exhibits no tenderness.  Abdominal: Soft. There is no tenderness. There is no rebound.  Musculoskeletal: Normal range of motion. He exhibits no edema and no tenderness.       Baseline ROM, no obvious new focal weakness.  Lymphadenopathy:    He has no cervical adenopathy.  Neurological:       Mental status and motor strength appears baseline for patient and situation.  Skin: No rash noted.  Psychiatric: He has a normal mood and affect.    ED Course  Procedures (including critical care time)  ECG: Normal sinus rhythm, ventricular rate 79 beats a minute, normal axis, normal intervals, normal ECG, no significant change from August 2010. 11:18 PM the patient feels somewhat improved he remains  clinically stable but still has some dysphagia without chest pain or shortness of breath..  11:18 PM the patient feels much improved after a lidocaine nebulizer he wants discharge does not want to stay for further markers is reasonable I doubt acute coronary syndrome I doubt anaphylaxis or airway compromise. He states he is speaking normally has minimal if any difficulty swallowing his neck swelling is improved and he wants discharge.  Labs Reviewed  POCT I-STAT TROPONIN I  LAB REPORT - SCANNED   No results found.   1. Inhalation injury       MDM          Hurman Horn, MD 10/29/10 [phone]

## 2011-04-01 ENCOUNTER — Other Ambulatory Visit (HOSPITAL_COMMUNITY): Payer: Self-pay | Admitting: Internal Medicine

## 2011-04-01 ENCOUNTER — Ambulatory Visit (HOSPITAL_COMMUNITY)
Admission: RE | Admit: 2011-04-01 | Discharge: 2011-04-01 | Disposition: A | Discharge status: Home / Self Care | Payer: 59 | Source: Ambulatory Visit | Attending: Internal Medicine | Admitting: Internal Medicine

## 2011-04-01 DIAGNOSIS — R059 Cough, unspecified: Secondary | ICD-10-CM

## 2011-04-01 DIAGNOSIS — M479 Spondylosis, unspecified: Secondary | ICD-10-CM | POA: Insufficient documentation

## 2011-04-01 DIAGNOSIS — I77819 Aortic ectasia, unspecified site: Secondary | ICD-10-CM | POA: Insufficient documentation

## 2011-04-01 DIAGNOSIS — R05 Cough: Secondary | ICD-10-CM | POA: Insufficient documentation

## 2011-04-01 DIAGNOSIS — R509 Fever, unspecified: Secondary | ICD-10-CM | POA: Insufficient documentation

## 2011-06-20 ENCOUNTER — Other Ambulatory Visit (HOSPITAL_COMMUNITY): Payer: Self-pay | Admitting: Internal Medicine

## 2011-06-20 ENCOUNTER — Ambulatory Visit (HOSPITAL_COMMUNITY)
Admission: RE | Admit: 2011-06-20 | Discharge: 2011-06-20 | Disposition: A | Discharge status: Home / Self Care | Payer: 59 | Source: Ambulatory Visit | Attending: Internal Medicine | Admitting: Internal Medicine

## 2011-06-20 DIAGNOSIS — J9801 Acute bronchospasm: Secondary | ICD-10-CM

## 2013-04-27 ENCOUNTER — Other Ambulatory Visit (HOSPITAL_COMMUNITY): Payer: Self-pay | Admitting: Family Medicine

## 2013-04-27 ENCOUNTER — Ambulatory Visit (HOSPITAL_COMMUNITY)
Admission: RE | Admit: 2013-04-27 | Discharge: 2013-04-27 | Disposition: A | Discharge status: Home / Self Care | Payer: BC Managed Care – PPO | Source: Ambulatory Visit | Attending: Family Medicine | Admitting: Family Medicine

## 2013-04-27 DIAGNOSIS — R059 Cough, unspecified: Secondary | ICD-10-CM

## 2013-04-27 DIAGNOSIS — R05 Cough: Secondary | ICD-10-CM

## 2013-04-27 DIAGNOSIS — R0989 Other specified symptoms and signs involving the circulatory and respiratory systems: Secondary | ICD-10-CM | POA: Insufficient documentation

## 2013-04-27 DIAGNOSIS — R0609 Other forms of dyspnea: Secondary | ICD-10-CM | POA: Insufficient documentation

## 2013-06-10 ENCOUNTER — Other Ambulatory Visit (HOSPITAL_COMMUNITY): Payer: Self-pay

## 2013-06-10 DIAGNOSIS — R0602 Shortness of breath: Secondary | ICD-10-CM

## 2013-06-22 ENCOUNTER — Ambulatory Visit (HOSPITAL_COMMUNITY)
Admission: RE | Admit: 2013-06-22 | Discharge: 2013-06-22 | Disposition: A | Discharge status: Home / Self Care | Payer: BC Managed Care – PPO | Source: Ambulatory Visit | Attending: Pulmonary Disease | Admitting: Pulmonary Disease

## 2013-06-22 DIAGNOSIS — R0602 Shortness of breath: Secondary | ICD-10-CM | POA: Insufficient documentation

## 2013-06-22 MED ORDER — ALBUTEROL SULFATE (2.5 MG/3ML) 0.083% IN NEBU
2.5000 mg | INHALATION_SOLUTION | Freq: Once | RESPIRATORY_TRACT | Status: AC
  Administered 2013-06-22: 2.5 mg via RESPIRATORY_TRACT

## 2013-06-27 LAB — PULMONARY FUNCTION TEST
DL/VA % pred: 97 %
DL/VA: 4.53 ml/min/mmHg/L
DLCO cor % pred: 83 %
DLCO cor: 26.87 ml/min/mmHg
DLCO unc % pred: 83 %
DLCO unc: 26.87 ml/min/mmHg
FEF 25-75 Post: 4.44 L/sec
FEF 25-75 Pre: 3.67 L/sec
FEF2575-%Change-Post: 21 %
FEF2575-%Pred-Post: 143 %
FEF2575-%Pred-Pre: 118 %
FEV1-%Change-Post: 4 %
FEV1-%Pred-Post: 101 %
FEV1-%Pred-Pre: 97 %
FEV1-Post: 3.75 L
FEV1-Pre: 3.6 L
FEV1FVC-%Change-Post: 5 %
FEV1FVC-%Pred-Pre: 105 %
FEV6-%Change-Post: -1 %
FEV6-%Pred-Post: 94 %
FEV6-%Pred-Pre: 95 %
FEV6-Post: 4.4 L
FEV6-Pre: 4.44 L
FEV6FVC-%Pred-Post: 104 %
FEV6FVC-%Pred-Pre: 104 %
FVC-%Change-Post: -1 %
FVC-%Pred-Post: 90 %
FVC-%Pred-Pre: 92 %
FVC-Post: 4.4 L
FVC-Pre: 4.47 L
Post FEV1/FVC ratio: 85 %
Post FEV6/FVC ratio: 100 %
Pre FEV1/FVC ratio: 80 %
Pre FEV6/FVC Ratio: 100 %
RV % pred: 110 %
RV: 2.43 L
TLC % pred: 96 %
TLC: 6.78 L

## 2013-10-05 ENCOUNTER — Encounter (HOSPITAL_COMMUNITY): Payer: Self-pay | Admitting: Emergency Medicine

## 2013-10-05 ENCOUNTER — Emergency Department (HOSPITAL_COMMUNITY)
Admission: EM | Admit: 2013-10-05 | Discharge: 2013-10-05 | Disposition: A | Discharge status: Home / Self Care | Payer: Worker's Compensation | Attending: Emergency Medicine | Admitting: Emergency Medicine

## 2013-10-05 DIAGNOSIS — J449 Chronic obstructive pulmonary disease, unspecified: Secondary | ICD-10-CM | POA: Insufficient documentation

## 2013-10-05 DIAGNOSIS — S0190XA Unspecified open wound of unspecified part of head, initial encounter: Secondary | ICD-10-CM | POA: Diagnosis not present

## 2013-10-05 DIAGNOSIS — Y9389 Activity, other specified: Secondary | ICD-10-CM | POA: Diagnosis not present

## 2013-10-05 DIAGNOSIS — IMO0002 Reserved for concepts with insufficient information to code with codable children: Secondary | ICD-10-CM | POA: Insufficient documentation

## 2013-10-05 DIAGNOSIS — Y929 Unspecified place or not applicable: Secondary | ICD-10-CM | POA: Diagnosis not present

## 2013-10-05 DIAGNOSIS — J4489 Other specified chronic obstructive pulmonary disease: Secondary | ICD-10-CM | POA: Insufficient documentation

## 2013-10-05 DIAGNOSIS — S0191XA Laceration without foreign body of unspecified part of head, initial encounter: Secondary | ICD-10-CM

## 2013-10-05 DIAGNOSIS — Z7982 Long term (current) use of aspirin: Secondary | ICD-10-CM | POA: Diagnosis not present

## 2013-10-05 DIAGNOSIS — Z87891 Personal history of nicotine dependence: Secondary | ICD-10-CM | POA: Insufficient documentation

## 2013-10-05 DIAGNOSIS — Z23 Encounter for immunization: Secondary | ICD-10-CM | POA: Diagnosis not present

## 2013-10-05 HISTORY — DX: Chronic obstructive pulmonary disease, unspecified: J44.9

## 2013-10-05 MED ORDER — TETANUS-DIPHTH-ACELL PERTUSSIS 5-2.5-18.5 LF-MCG/0.5 IM SUSP
0.5000 mL | Freq: Once | INTRAMUSCULAR | Status: AC
  Administered 2013-10-05: 0.5 mL via INTRAMUSCULAR
  Filled 2013-10-05: qty 0.5

## 2013-10-05 NOTE — ED Notes (Addendum)
Pt hit head on forklift. Laceration to right occipital area. Bleeding controlled. No LOC at the time of incident, pt reports dizziness and "I just don't feel right" at this time.

## 2013-10-05 NOTE — ED Provider Notes (Signed)
CSN: [id number]     Arrival date & time 10/05/13  1024 History  This chart was scribed for Maudry Diego, MD by Molli Posey, ED Scribe. This patient was seen in room [facility] and the patient's care was started 12:07 PM.    Chief Complaint  Patient presents with  . Head Laceration   Patient is a 58 y.o. male presenting with scalp laceration. The history is provided by the patient. No language interpreter was used.  Head Laceration This is a new problem. The current episode started 3 to 5 hours ago. Pertinent negatives include no chest pain, no abdominal pain and no headaches.    HPI Comments: James Harris is a 58 y.o. male who presents to the Emergency Department complaining of head laceration PTA. Patient reports his foot slipped at work and his head hit the side of a forklift. Patient reports "seeing stars" after the event but denies LOC. Patient denies falling or any other injuries. He has associated pain to the affected area. Bleeding is controlled currently. His tetanus is out of date.   PCP Luan Pulling   Past Medical History  Diagnosis Date  . COPD (chronic obstructive pulmonary disease)    Past Surgical History  Procedure Laterality Date  . Knee surgery  bilateral  . Tonsillectomy     History reviewed. No pertinent family history. History  Substance Use Topics  . Smoking status: Former Research scientist (life sciences)  . Smokeless tobacco: Not on file  . Alcohol Use: Yes     Comment: rarely    Review of Systems  Constitutional: Negative for appetite change and fatigue.  HENT: Negative for congestion, ear discharge and sinus pressure.   Eyes: Negative for discharge.  Respiratory: Negative for cough.   Cardiovascular: Negative for chest pain.  Gastrointestinal: Negative for abdominal pain and diarrhea.  Genitourinary: Negative for frequency and hematuria.  Musculoskeletal: Negative for back pain.  Skin: Positive for wound. Negative for rash.  Neurological: Negative for seizures and  headaches.  Psychiatric/Behavioral: Negative for hallucinations.    Allergies  Review of patient's allergies indicates no known allergies.  Home Medications   Prior to Admission medications   Medication Sig Start Date End Date Taking? Authorizing Provider  aspirin 81 MG chewable tablet Chew 81 mg by mouth daily.      Historical Provider, MD  ibuprofen (ADVIL,MOTRIN) 200 MG tablet Take 400 mg by mouth every 8 (eight) hours as needed. Knee inflammation     Historical Provider, MD   BP 138/75  Pulse 85  Temp(Src) 98.6 F (37 C) (Oral)  Ht 6' (1.829 m)  Wt 225 lb (102.059 kg)  BMI 30.51 kg/m2  SpO2 98%  Physical Exam  Nursing note and vitals reviewed. Constitutional: He is oriented to person, place, and time. He appears well-developed.  HENT:  Head: Normocephalic.  4 cm laceration to the top of the head   Eyes: Conjunctivae and EOM are normal. No scleral icterus.  Neck: Neck supple. No thyromegaly present.  Cardiovascular: Normal rate and regular rhythm.  Exam reveals no gallop and no friction rub.   No murmur heard. Pulmonary/Chest: No stridor. He has no wheezes. He has no rales. He exhibits no tenderness.  Abdominal: He exhibits no distension. There is no tenderness. There is no rebound.  Musculoskeletal: Normal range of motion. He exhibits no edema.  Lymphadenopathy:    He has no cervical adenopathy.  Neurological: He is oriented to person, place, and time. He exhibits normal muscle tone. Coordination normal.  Skin:  No rash noted. No erythema.  Psychiatric: He has a normal mood and affect. His behavior is normal.    ED Course  Procedures (including critical care time)  DIAGNOSTIC STUDIES: Oxygen Saturation is 98% on RA, normal by my interpretation.    COORDINATION OF CARE: 12:07 PM Will repair laceration with staples. Discussed treatment plan with pt at bedside and pt agreed to plan.  12:39 PM LACERATION REPAIR Performed by: Dr. Roderic Palau  Consent: Verbal consent  obtained. Risks and benefits: risks, benefits and alternatives were discussed Patient identity confirmed: provided demographic data Time out performed prior to procedure Prepped and Draped in normal sterile fashion Wound explored Laceration Location: top of head Laceration Length: 4 cm No Foreign Bodies seen or palpated Anesthesia: none used Amount of cleaning: standard with betadine  Skin closure: staples Number of sutures or staples: 6 Patient tolerance: Patient tolerated the procedure well with no immediate complications.   Labs Review Labs Reviewed - No data to display  Imaging Review No results found.   EKG Interpretation None      MDM   Final diagnoses:  None   The chart was scribed for me under my direct supervision.  I personally performed the history, physical, and medical decision making and all procedures in the evaluation of this patient.Maudry Diego, MD 10/05/13 1600

## 2013-10-05 NOTE — Discharge Instructions (Signed)
Sutures out in 1 week.  Clean area twice a day with soap and water

## 2013-10-05 NOTE — ED Notes (Addendum)
Pt states he hit his head on a forklift. Pt has lac to right head. Lac approximately 3.75 cm in length. Unable to see how deep wound is. Neuro assessment WNL. Pt states "I just feel off." Pt denies LOC on scene, denies dizziness, etc.

## 2013-10-14 ENCOUNTER — Encounter (HOSPITAL_COMMUNITY): Payer: Self-pay | Admitting: Emergency Medicine

## 2013-10-14 ENCOUNTER — Emergency Department (INDEPENDENT_AMBULATORY_CARE_PROVIDER_SITE_OTHER)
Admission: EM | Admit: 2013-10-14 | Discharge: 2013-10-14 | Disposition: A | Discharge status: Home / Self Care | Payer: Worker's Compensation | Source: Home / Self Care | Attending: Family Medicine | Admitting: Family Medicine

## 2013-10-14 DIAGNOSIS — Z4802 Encounter for removal of sutures: Secondary | ICD-10-CM

## 2013-10-14 NOTE — Discharge Instructions (Signed)
Return as needed, no special care needed.

## 2013-10-14 NOTE — ED Notes (Signed)
Patient presents for staple removal. Patient has 6 staples in his anterior scalp. Patient is alert and oriented and in NAD.

## 2013-10-14 NOTE — ED Provider Notes (Signed)
CSN: [id number]     Arrival date & time 10/14/13  1612 History   First MD Initiated Contact with Patient 10/14/13 1620     Chief Complaint  Patient presents with  . Suture / Staple Removal   (Consider location/radiation/quality/duration/timing/severity/associated sxs/prior Treatment) Patient is a 58 y.o. male presenting with suture removal. The history is provided by the patient.  Suture / Staple Removal This is a new problem. The current episode started more than 1 week ago (right ant scalp lac on 9/23, 6 staples placed, well healed, no problems.).    Past Medical History  Diagnosis Date  . COPD (chronic obstructive pulmonary disease)    Past Surgical History  Procedure Laterality Date  . Knee surgery  bilateral  . Tonsillectomy     No family history on file. History  Substance Use Topics  . Smoking status: Former Research scientist (life sciences)  . Smokeless tobacco: Not on file  . Alcohol Use: Yes     Comment: rarely    Review of Systems  Constitutional: Negative.   Skin: Positive for wound.    Allergies  Review of patient's allergies indicates no known allergies.  Home Medications   Prior to Admission medications   Medication Sig Start Date End Date Taking? Authorizing Provider  aspirin 81 MG chewable tablet Chew 81 mg by mouth daily.      Historical Provider, MD  ibuprofen (ADVIL,MOTRIN) 200 MG tablet Take 400 mg by mouth every 8 (eight) hours as needed. Knee inflammation     Historical Provider, MD   BP 136/84  Pulse 68  Temp(Src) 99.3 F (37.4 C) (Oral)  Resp 14  SpO2 97% Physical Exam  Nursing note and vitals reviewed. Constitutional: He is oriented to person, place, and time. He appears well-developed and well-nourished. No distress.  Neurological: He is alert and oriented to person, place, and time.  Skin: Skin is warm and dry.  Lac to scalp healed, 6 staples removed without problem.    ED Course  Procedures (including critical care time) Labs Review Labs Reviewed - No  data to display  Imaging Review No results found.   MDM   1. Removal of staples        Billy Fischer, MD 10/14/13 [id number]

## 2014-02-23 ENCOUNTER — Ambulatory Visit (HOSPITAL_COMMUNITY)
Admission: RE | Admit: 2014-02-23 | Discharge: 2014-02-23 | Disposition: A | Discharge status: Home / Self Care | Payer: BLUE CROSS/BLUE SHIELD | Source: Ambulatory Visit | Attending: Pulmonary Disease | Admitting: Pulmonary Disease

## 2014-02-23 ENCOUNTER — Other Ambulatory Visit (HOSPITAL_COMMUNITY): Payer: Self-pay | Admitting: Pulmonary Disease

## 2014-02-23 DIAGNOSIS — J449 Chronic obstructive pulmonary disease, unspecified: Secondary | ICD-10-CM | POA: Diagnosis not present

## 2014-02-23 DIAGNOSIS — R059 Cough, unspecified: Secondary | ICD-10-CM

## 2014-02-23 DIAGNOSIS — R05 Cough: Secondary | ICD-10-CM | POA: Insufficient documentation

## 2014-02-23 DIAGNOSIS — Z87891 Personal history of nicotine dependence: Secondary | ICD-10-CM | POA: Insufficient documentation

## 2014-03-09 ENCOUNTER — Other Ambulatory Visit (HOSPITAL_COMMUNITY): Payer: Self-pay | Admitting: Pulmonary Disease

## 2014-03-09 DIAGNOSIS — R911 Solitary pulmonary nodule: Secondary | ICD-10-CM

## 2014-03-10 ENCOUNTER — Ambulatory Visit (HOSPITAL_COMMUNITY)
Admission: RE | Admit: 2014-03-10 | Discharge: 2014-03-10 | Disposition: A | Discharge status: Home / Self Care | Payer: BLUE CROSS/BLUE SHIELD | Source: Ambulatory Visit | Attending: Pulmonary Disease | Admitting: Pulmonary Disease

## 2014-03-10 DIAGNOSIS — R911 Solitary pulmonary nodule: Secondary | ICD-10-CM | POA: Insufficient documentation

## 2014-03-10 MED ORDER — IOHEXOL 300 MG/ML  SOLN
80.0000 mL | Freq: Once | INTRAMUSCULAR | Status: AC | PRN
  Administered 2014-03-10: 80 mL via INTRAVENOUS

## 2014-03-10 MED ORDER — SODIUM CHLORIDE 0.9 % IJ SOLN
INTRAMUSCULAR | Status: AC
  Filled 2014-03-10: qty 45

## 2014-03-10 MED ORDER — SODIUM CHLORIDE 0.9 % IJ SOLN
INTRAMUSCULAR | Status: AC
  Filled 2014-03-10: qty 500

## 2015-10-24 ENCOUNTER — Other Ambulatory Visit (HOSPITAL_COMMUNITY): Payer: Self-pay | Admitting: Respiratory Therapy

## 2015-10-24 DIAGNOSIS — R0602 Shortness of breath: Secondary | ICD-10-CM

## 2015-11-07 ENCOUNTER — Ambulatory Visit (HOSPITAL_COMMUNITY)
Admission: RE | Admit: 2015-11-07 | Discharge: 2015-11-07 | Disposition: A | Discharge status: Home / Self Care | Payer: BLUE CROSS/BLUE SHIELD | Source: Ambulatory Visit | Attending: Pulmonary Disease | Admitting: Pulmonary Disease

## 2015-11-07 DIAGNOSIS — R0602 Shortness of breath: Secondary | ICD-10-CM | POA: Insufficient documentation

## 2015-11-07 LAB — PULMONARY FUNCTION TEST
DL/VA % pred: 82 %
DL/VA: 3.88 ml/min/mmHg/L
DLCO unc % pred: 78 %
DLCO unc: 27.47 ml/min/mmHg
FEF 25-75 Post: 3.97 L/sec
FEF 25-75 Pre: 3.52 L/sec
FEF2575-%Change-Post: 12 %
FEF2575-%Pred-Post: 125 %
FEF2575-%Pred-Pre: 110 %
FEV1-%Change-Post: 9 %
FEV1-%Pred-Post: 95 %
FEV1-%Pred-Pre: 86 %
FEV1-Post: 3.68 L
FEV1-Pre: 3.37 L
FEV1FVC-%Change-Post: 3 %
FEV1FVC-%Pred-Pre: 105 %
FEV6-%Change-Post: 6 %
FEV6-%Pred-Post: 90 %
FEV6-%Pred-Pre: 85 %
FEV6-Post: 4.44 L
FEV6-Pre: 4.18 L
FEV6FVC-%Change-Post: 0 %
FEV6FVC-%Pred-Post: 104 %
FEV6FVC-%Pred-Pre: 103 %
FVC-%Change-Post: 5 %
FVC-%Pred-Post: 86 %
FVC-%Pred-Pre: 82 %
FVC-Post: 4.44 L
FVC-Pre: 4.2 L
Post FEV1/FVC ratio: 83 %
Post FEV6/FVC ratio: 100 %
Pre FEV1/FVC ratio: 80 %
Pre FEV6/FVC Ratio: 100 %
RV % pred: 122 %
RV: 2.88 L
TLC % pred: 95 %
TLC: 7.1 L

## 2015-11-07 MED ORDER — ALBUTEROL SULFATE (2.5 MG/3ML) 0.083% IN NEBU
2.5000 mg | INHALATION_SOLUTION | Freq: Once | RESPIRATORY_TRACT | Status: AC
  Administered 2015-11-07: 2.5 mg via RESPIRATORY_TRACT

## 2015-11-15 ENCOUNTER — Other Ambulatory Visit (HOSPITAL_COMMUNITY): Payer: Self-pay | Admitting: Pulmonary Disease

## 2015-11-15 DIAGNOSIS — R0602 Shortness of breath: Secondary | ICD-10-CM

## 2015-11-19 ENCOUNTER — Ambulatory Visit (HOSPITAL_COMMUNITY)
Admission: RE | Admit: 2015-11-19 | Discharge: 2015-11-19 | Disposition: A | Discharge status: Home / Self Care | Payer: BLUE CROSS/BLUE SHIELD | Source: Ambulatory Visit | Attending: Pulmonary Disease | Admitting: Pulmonary Disease

## 2015-11-19 DIAGNOSIS — I700 Atherosclerosis of aorta: Secondary | ICD-10-CM | POA: Insufficient documentation

## 2015-11-19 DIAGNOSIS — R0602 Shortness of breath: Secondary | ICD-10-CM | POA: Diagnosis present

## 2015-11-19 DIAGNOSIS — I2510 Atherosclerotic heart disease of native coronary artery without angina pectoris: Secondary | ICD-10-CM | POA: Diagnosis not present

## 2015-11-22 ENCOUNTER — Other Ambulatory Visit (HOSPITAL_COMMUNITY): Payer: Self-pay | Admitting: Pulmonary Disease

## 2015-11-22 DIAGNOSIS — E041 Nontoxic single thyroid nodule: Secondary | ICD-10-CM

## 2015-11-26 ENCOUNTER — Ambulatory Visit (HOSPITAL_COMMUNITY)
Admission: RE | Admit: 2015-11-26 | Discharge: 2015-11-26 | Disposition: A | Discharge status: Home / Self Care | Payer: BLUE CROSS/BLUE SHIELD | Source: Ambulatory Visit | Attending: Pulmonary Disease | Admitting: Pulmonary Disease

## 2015-11-26 DIAGNOSIS — R0602 Shortness of breath: Secondary | ICD-10-CM

## 2015-11-26 DIAGNOSIS — I498 Other specified cardiac arrhythmias: Secondary | ICD-10-CM | POA: Diagnosis not present

## 2015-11-28 ENCOUNTER — Ambulatory Visit (HOSPITAL_COMMUNITY)
Admission: RE | Admit: 2015-11-28 | Discharge: 2015-11-28 | Disposition: A | Discharge status: Home / Self Care | Payer: BLUE CROSS/BLUE SHIELD | Source: Ambulatory Visit | Attending: Pulmonary Disease | Admitting: Pulmonary Disease

## 2015-11-28 DIAGNOSIS — E041 Nontoxic single thyroid nodule: Secondary | ICD-10-CM

## 2015-12-13 ENCOUNTER — Other Ambulatory Visit (HOSPITAL_COMMUNITY): Payer: Self-pay | Admitting: Pulmonary Disease

## 2015-12-13 DIAGNOSIS — E041 Nontoxic single thyroid nodule: Secondary | ICD-10-CM

## 2015-12-20 ENCOUNTER — Encounter (HOSPITAL_COMMUNITY): Payer: Self-pay

## 2015-12-20 ENCOUNTER — Ambulatory Visit (HOSPITAL_COMMUNITY)
Admission: RE | Admit: 2015-12-20 | Discharge: 2015-12-20 | Disposition: A | Discharge status: Home / Self Care | Payer: BLUE CROSS/BLUE SHIELD | Source: Ambulatory Visit | Attending: Pulmonary Disease | Admitting: Pulmonary Disease

## 2015-12-20 DIAGNOSIS — E041 Nontoxic single thyroid nodule: Secondary | ICD-10-CM | POA: Insufficient documentation

## 2015-12-20 MED ORDER — LIDOCAINE HCL (PF) 2 % IJ SOLN
INTRAMUSCULAR | Status: AC
  Filled 2015-12-20: qty 10

## 2015-12-20 NOTE — Discharge Instructions (Signed)
Thyroid Biopsy °The thyroid gland is a butterfly-shaped gland located in the front of the neck. It produces hormones that affect metabolism, growth and development, and body temperature. Thyroid biopsy is a procedure in which small samples of tissue or fluid are removed from the thyroid gland. The samples are then looked at under a microscope to check for abnormalities. This procedure is done to determine the cause of thyroid problems. It may be done to check for infection, cancer, or other thyroid problems. °Two methods may be used for a thyroid biopsy. In one method, a thin needle is inserted through the skin and into the thyroid gland. In the other method, an open incision is made through the skin. °Tell a health care provider about: °· Any allergies you have. °· All medicines you are taking, including vitamins, herbs, eye drops, creams, and over-the-counter medicines. °· Any problems you or family members have had with anesthetic medicines. °· Any blood disorders you have. °· Any surgeries you have had. °· Any medical conditions you have. °What are the risks? °Generally, this is a safe procedure. However, problems can occur and include: °· Bleeding from the procedure site. °· Infection. °· Injury to structures near the thyroid gland. °What happens before the procedure? °· Ask your health care provider about: °¨ Changing or stopping your regular medicines. This is especially important if you are taking diabetes medicines or blood thinners. °¨ Taking medicines such as aspirin and ibuprofen. These medicines can thin your blood. Do not take these medicines before your procedure if your health care provider asks you not to. °· Do not eat or drink anything after midnight on the night before the procedure or as directed by your health care provider. °· You may have a blood sample taken. °What happens during the procedure? °Either of these methods may be used to perform a thyroid biopsy: °· Fine needle biopsy. You may  be given medicine to help you relax (sedative). You will be asked to lie on your back with your head tipped backward to extend your neck. An area on your neck will be cleaned. A needle will then be inserted through the skin of your neck. You may be asked to avoid coughing, talking, swallowing, or making sounds during some portions of the procedure. The needle will be withdrawn once the tissue or fluid samples have been removed. Pressure may be applied to your neck to reduce swelling and ensure that bleeding has stopped. The samples will be sent to a lab for examination. °· Open biopsy. You will be given medicine to make you sleep (general anesthetic). An incision will be made in your neck. A sample of thyroid tissue will be removed using surgical tools. The tissue sample will be sent for examination. In some cases, the sample may be examined during the biopsy. If that is done and cancer cells are found, some or all of the thyroid gland may be removed. The incision will be closed with stitches. °What happens after the procedure? °· Your recovery will be assessed and monitored. °· You may have soreness and tenderness at the site of the biopsy. This should go away after a few days. °· If you had an open biopsy, you may have a hoarse voice or sore throat for a couple days. °· It is your responsibility to get your test results. °This information is not intended to replace advice given to you by your health care provider. Make sure you discuss any questions you have with your health   care provider. °Document Released: 10/27/2006 Document Revised: 09/02/2015 Document Reviewed: 03/24/2013 °Elsevier Interactive Patient Education © 2017 [employer] ° °

## 2016-02-26 ENCOUNTER — Ambulatory Visit (HOSPITAL_COMMUNITY)
Admission: RE | Admit: 2016-02-26 | Discharge: 2016-02-26 | Disposition: A | Discharge status: Home / Self Care | Payer: Managed Care, Other (non HMO) | Source: Ambulatory Visit | Attending: Pulmonary Disease | Admitting: Pulmonary Disease

## 2016-02-26 ENCOUNTER — Other Ambulatory Visit (HOSPITAL_COMMUNITY): Payer: Self-pay | Admitting: Pulmonary Disease

## 2016-02-26 DIAGNOSIS — R05 Cough: Secondary | ICD-10-CM

## 2016-02-26 DIAGNOSIS — R0989 Other specified symptoms and signs involving the circulatory and respiratory systems: Secondary | ICD-10-CM | POA: Insufficient documentation

## 2016-02-26 DIAGNOSIS — I517 Cardiomegaly: Secondary | ICD-10-CM | POA: Insufficient documentation

## 2016-02-26 DIAGNOSIS — R059 Cough, unspecified: Secondary | ICD-10-CM

## 2016-06-11 ENCOUNTER — Other Ambulatory Visit (HOSPITAL_COMMUNITY): Payer: Self-pay | Admitting: Respiratory Therapy

## 2016-06-11 DIAGNOSIS — R0602 Shortness of breath: Secondary | ICD-10-CM

## 2016-06-13 ENCOUNTER — Ambulatory Visit (HOSPITAL_COMMUNITY)
Admission: RE | Admit: 2016-06-13 | Discharge: 2016-06-13 | Disposition: A | Discharge status: Home / Self Care | Payer: Managed Care, Other (non HMO) | Source: Ambulatory Visit | Attending: Pulmonary Disease | Admitting: Pulmonary Disease

## 2016-06-13 DIAGNOSIS — J449 Chronic obstructive pulmonary disease, unspecified: Secondary | ICD-10-CM | POA: Diagnosis not present

## 2016-06-13 DIAGNOSIS — R0602 Shortness of breath: Secondary | ICD-10-CM | POA: Insufficient documentation

## 2016-06-13 DIAGNOSIS — Z87891 Personal history of nicotine dependence: Secondary | ICD-10-CM | POA: Diagnosis not present

## 2016-06-13 MED ORDER — ALBUTEROL SULFATE (2.5 MG/3ML) 0.083% IN NEBU
2.5000 mg | INHALATION_SOLUTION | Freq: Once | RESPIRATORY_TRACT | Status: AC
  Administered 2016-06-13: 2.5 mg via RESPIRATORY_TRACT

## 2016-06-14 LAB — PULMONARY FUNCTION TEST
DL/VA % pred: 73 %
DL/VA: 3.47 ml/min/mmHg/L
DLCO cor % pred: 71 %
DLCO cor: 25.15 ml/min/mmHg
DLCO unc % pred: 71 %
DLCO unc: 25.15 ml/min/mmHg
FEF 25-75 Post: 3.71 L/sec
FEF 25-75 Pre: 4.22 L/sec
FEF2575-%Change-Post: -12 %
FEF2575-%Pred-Post: 118 %
FEF2575-%Pred-Pre: 134 %
FEV1-%Change-Post: -1 %
FEV1-%Pred-Post: 90 %
FEV1-%Pred-Pre: 91 %
FEV1-Post: 3.49 L
FEV1-Pre: 3.54 L
FEV1FVC-%Change-Post: 0 %
FEV1FVC-%Pred-Pre: 110 %
FEV6-%Change-Post: 0 %
FEV6-%Pred-Post: 86 %
FEV6-%Pred-Pre: 86 %
FEV6-Post: 4.21 L
FEV6-Pre: 4.23 L
FEV6FVC-%Pred-Post: 105 %
FEV6FVC-%Pred-Pre: 105 %
FVC-%Change-Post: 0 %
FVC-%Pred-Post: 82 %
FVC-%Pred-Pre: 82 %
FVC-Post: 4.21 L
FVC-Pre: 4.23 L
Post FEV1/FVC ratio: 83 %
Post FEV6/FVC ratio: 100 %
Pre FEV1/FVC ratio: 84 %
Pre FEV6/FVC Ratio: 100 %
RV % pred: 119 %
RV: 2.83 L
TLC % pred: 91 %
TLC: 6.8 L

## 2016-07-21 ENCOUNTER — Other Ambulatory Visit (HOSPITAL_COMMUNITY): Payer: Self-pay | Admitting: Respiratory Therapy

## 2016-07-21 DIAGNOSIS — J45909 Unspecified asthma, uncomplicated: Secondary | ICD-10-CM

## 2016-07-24 ENCOUNTER — Ambulatory Visit (HOSPITAL_COMMUNITY)
Admission: RE | Admit: 2016-07-24 | Discharge: 2016-07-24 | Disposition: A | Discharge status: Home / Self Care | Payer: Managed Care, Other (non HMO) | Source: Ambulatory Visit | Attending: Pulmonary Disease | Admitting: Pulmonary Disease

## 2016-07-24 DIAGNOSIS — J45909 Unspecified asthma, uncomplicated: Secondary | ICD-10-CM | POA: Diagnosis not present

## 2016-07-24 LAB — PULMONARY FUNCTION TEST
FEF 25-75 Post: 4.01 L/sec
FEF 25-75 Pre: 3.95 L/sec
FEF2575-%Change-Post: 1 %
FEF2575-%Pred-Post: 128 %
FEF2575-%Pred-Pre: 126 %
FEV1-%Change-Post: 0 %
FEV1-%Pred-Post: 99 %
FEV1-%Pred-Pre: 100 %
FEV1-Post: 3.84 L
FEV1-Pre: 3.85 L
FEV1FVC-%Change-Post: 4 %
FEV1FVC-%Pred-Pre: 107 %
FEV6-%Change-Post: -4 %
FEV6-%Pred-Post: 92 %
FEV6-%Pred-Pre: 96 %
FEV6-Post: 4.49 L
FEV6-Pre: 4.68 L
FEV6FVC-%Change-Post: 0 %
FEV6FVC-%Pred-Post: 105 %
FEV6FVC-%Pred-Pre: 104 %
FVC-%Change-Post: -4 %
FVC-%Pred-Post: 88 %
FVC-%Pred-Pre: 92 %
FVC-Post: 4.49 L
FVC-Pre: 4.7 L
Post FEV1/FVC ratio: 85 %
Post FEV6/FVC ratio: 100 %
Pre FEV1/FVC ratio: 82 %
Pre FEV6/FVC Ratio: 100 %

## 2016-07-24 MED ORDER — METHACHOLINE 4 MG/ML NEB SOLN
2.0000 mL | Freq: Once | RESPIRATORY_TRACT | Status: AC
  Administered 2016-07-24: 8 mg via RESPIRATORY_TRACT

## 2016-07-24 MED ORDER — SODIUM CHLORIDE 0.9 % IN NEBU
3.0000 mL | INHALATION_SOLUTION | Freq: Once | RESPIRATORY_TRACT | Status: AC
  Administered 2016-07-24: 3 mL via RESPIRATORY_TRACT

## 2016-07-24 MED ORDER — ALBUTEROL SULFATE (2.5 MG/3ML) 0.083% IN NEBU
2.5000 mg | INHALATION_SOLUTION | Freq: Once | RESPIRATORY_TRACT | Status: AC
  Administered 2016-07-24: 2.5 mg via RESPIRATORY_TRACT

## 2016-07-24 MED ORDER — METHACHOLINE 1 MG/ML NEB SOLN
2.0000 mL | Freq: Once | RESPIRATORY_TRACT | Status: AC
  Administered 2016-07-24: 2 mg via RESPIRATORY_TRACT

## 2016-07-24 MED ORDER — METHACHOLINE 16 MG/ML NEB SOLN: 2.0000 mL | Freq: Once | RESPIRATORY_TRACT | Status: DC

## 2016-07-24 MED ORDER — METHACHOLINE 0.0625 MG/ML NEB SOLN
2.0000 mL | Freq: Once | RESPIRATORY_TRACT | Status: AC
  Administered 2016-07-24: 0.125 mg via RESPIRATORY_TRACT

## 2016-07-24 MED ORDER — METHACHOLINE 0.25 MG/ML NEB SOLN
2.0000 mL | Freq: Once | RESPIRATORY_TRACT | Status: AC
  Administered 2016-07-24: 0.5 mg via RESPIRATORY_TRACT

## 2017-02-09 ENCOUNTER — Ambulatory Visit (HOSPITAL_COMMUNITY)
Admission: RE | Admit: 2017-02-09 | Discharge: 2017-02-09 | Disposition: A | Discharge status: Home / Self Care | Payer: BLUE CROSS/BLUE SHIELD | Source: Ambulatory Visit | Attending: Pulmonary Disease | Admitting: Pulmonary Disease

## 2017-02-09 ENCOUNTER — Other Ambulatory Visit (HOSPITAL_COMMUNITY): Payer: Self-pay | Admitting: Pulmonary Disease

## 2017-02-09 DIAGNOSIS — M79605 Pain in left leg: Secondary | ICD-10-CM | POA: Insufficient documentation

## 2017-06-19 IMAGING — CT CT CHEST W/O CM
2 of 3 series · 15 of 36 positions shown, 18 images · non-contrast
Comparison: CT scan 03/10/2014

CLINICAL DATA: Chest pain, intermittent shortness of breath

EXAM:
CT CHEST WITHOUT CONTRAST
TECHNIQUE: Multidetector CT imaging of the chest was performed following the
standard protocol without IV contrast.

[Series 2: thorax · axial · 0.78mm/px · z∈[-9,+267]mm · 12 of 162 slices shown, 15 images]
[im 12/162  mediastinal]
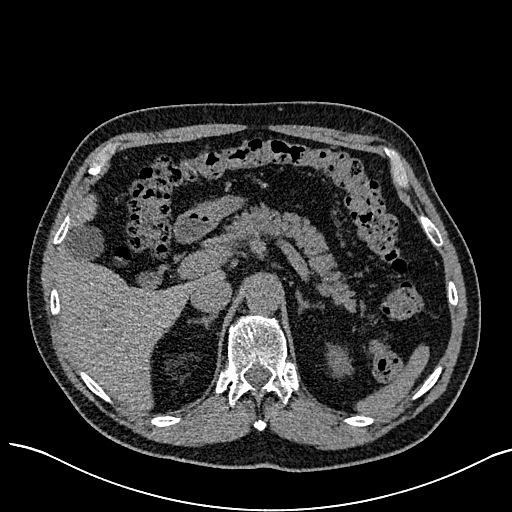
[im 12/162  lung]
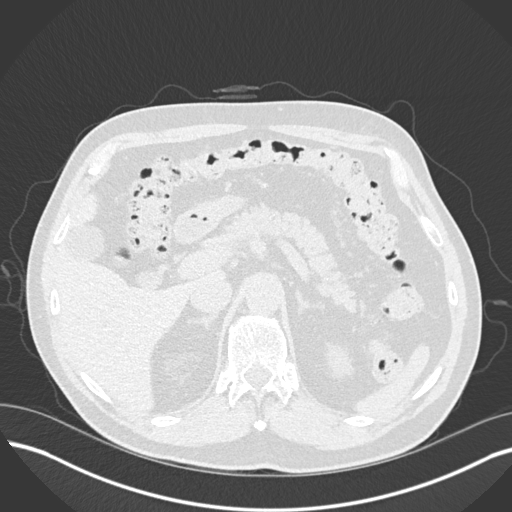
[im 24/162  lung]
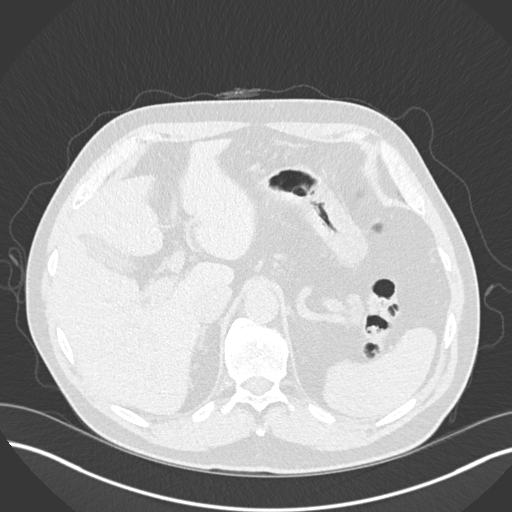
[im 36/162  lung]
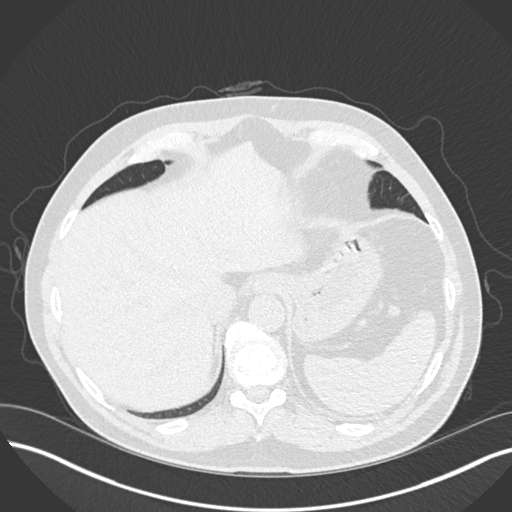
[im 48/162  lung]
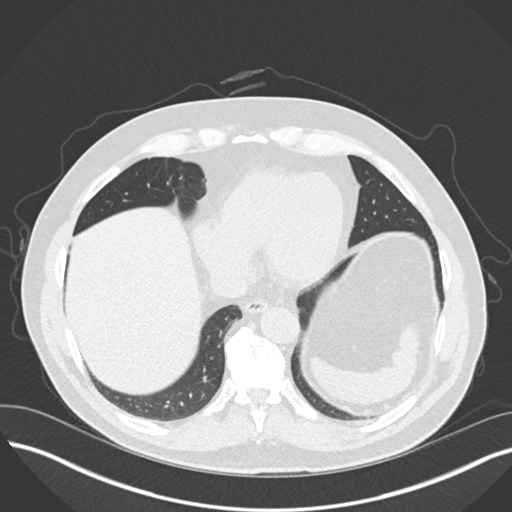
[im 60/162  mediastinal]
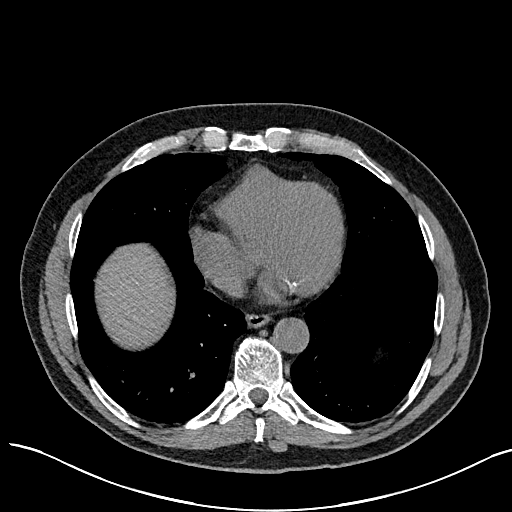
[im 60/162  lung]
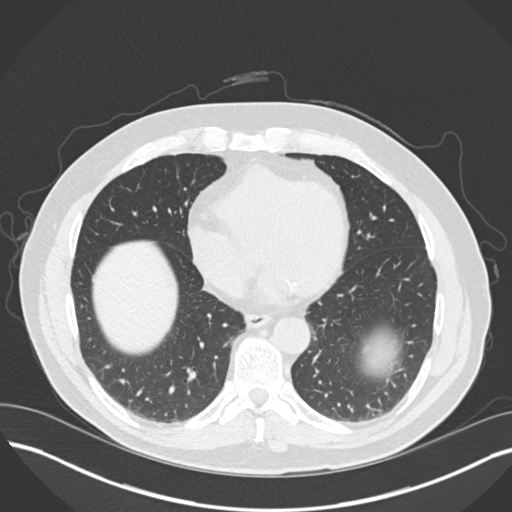
[im 72/162  lung]
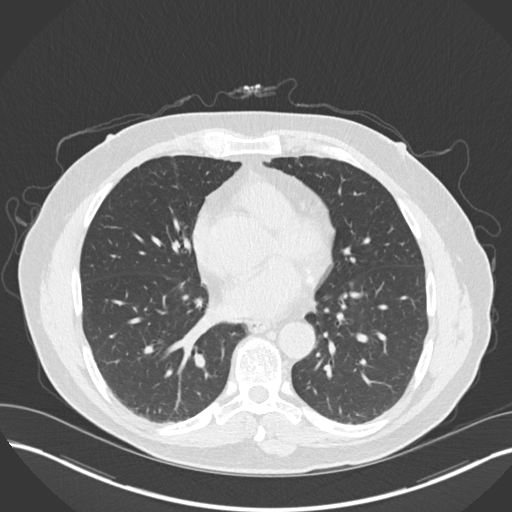
[im 90/162  lung]
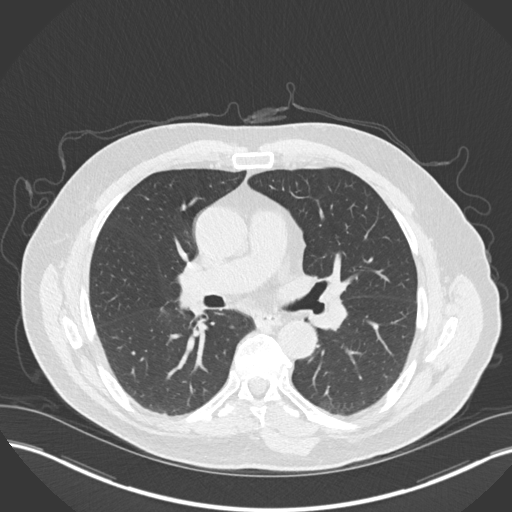
[im 102/162  lung]
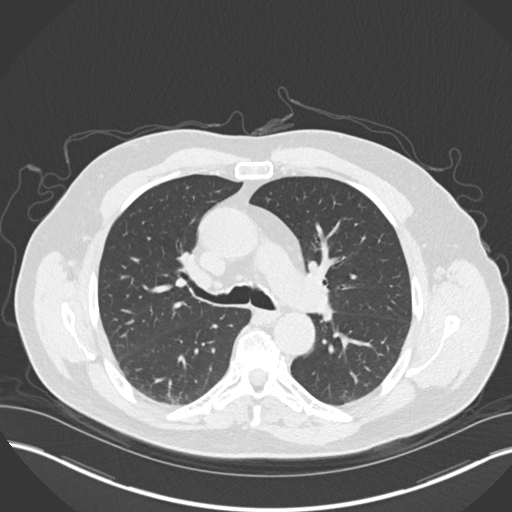
[im 114/162  mediastinal]
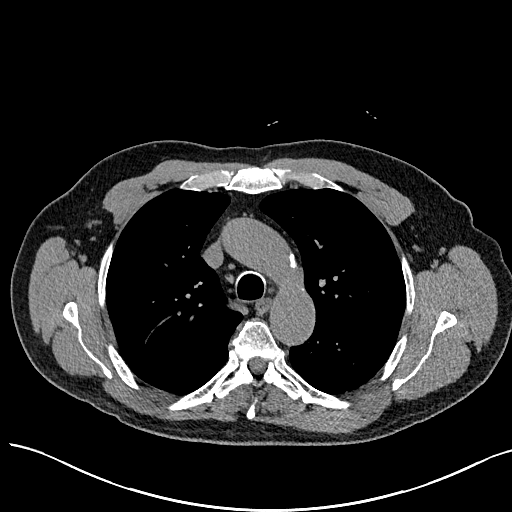
[im 114/162  lung]
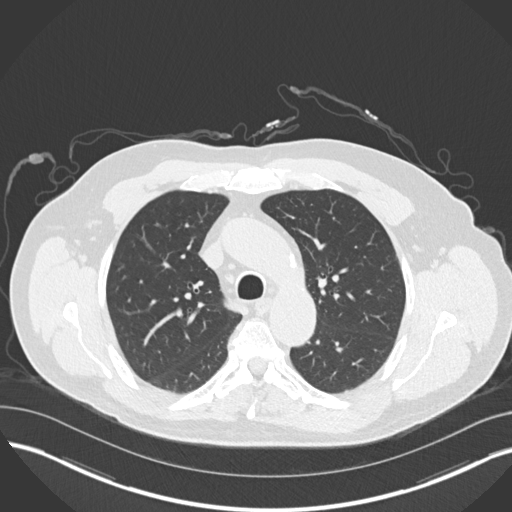
[im 126/162  lung]
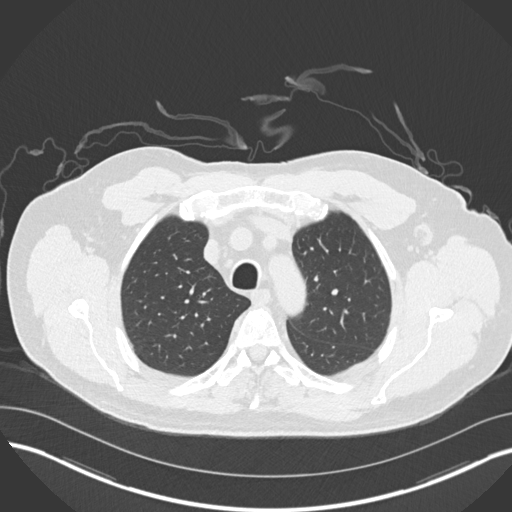
[im 138/162  lung]
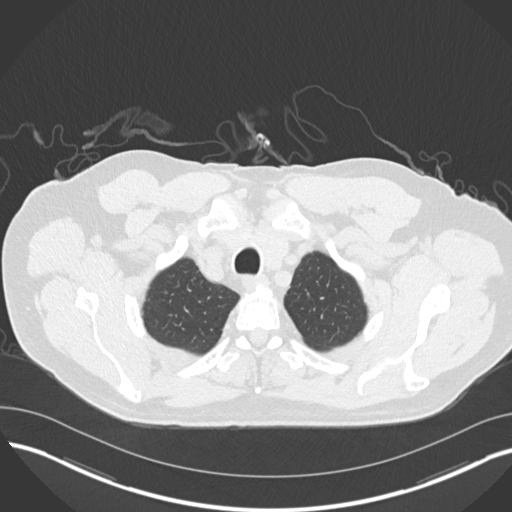
[im 150/162  lung]
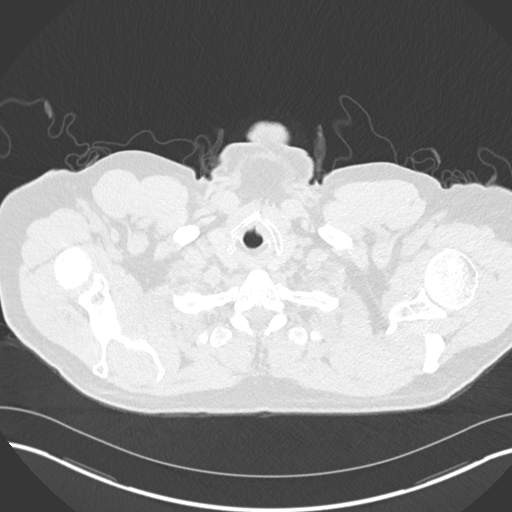

[Series 5: coronal · coronal · 0.67mm/px · 3 of 151 slices shown]
[im 31/151  lung]
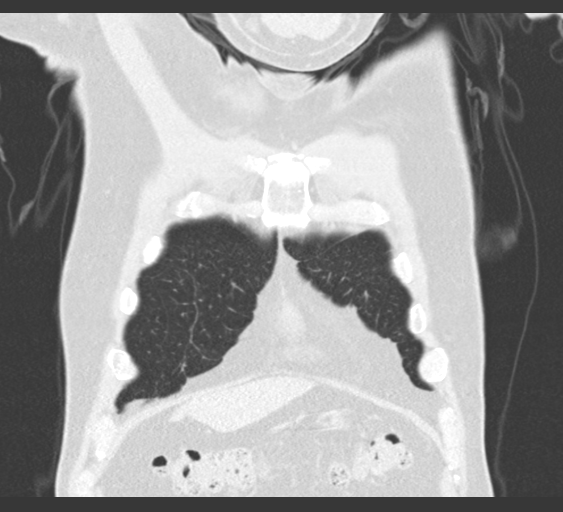
[im 61/151  lung]
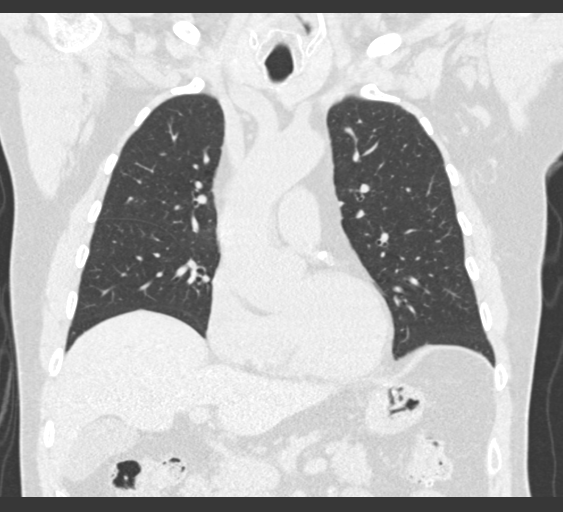
[im 91/151  lung]
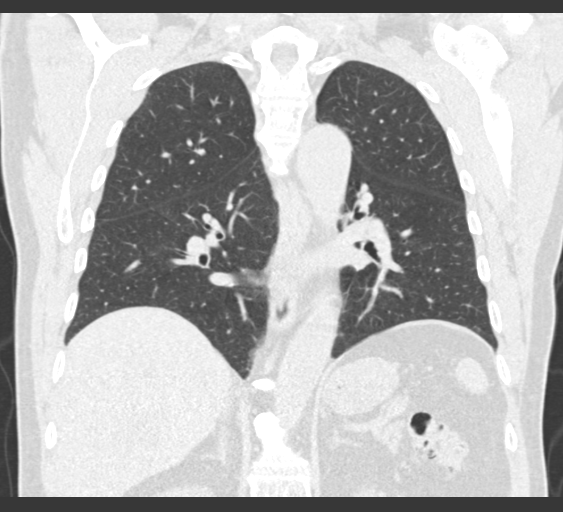

[15 of 36 positions shown; findings below may reference images not displayed]

FINDINGS: Cardiovascular: Mild atherosclerotic calcifications of thoracic
aorta. Atherosclerotic calcifications of coronary arteries. Heart
size within normal limits. No pericardial effusion.

Mediastinum/Nodes: There is no mediastinal hematoma or adenopathy.
Small nonspecific mediastinal lymph nodes are noted. There is poorly
visualized nodule left lobe of thyroid gland medially measures
cm. Further correlation with thyroid gland ultrasound is
recommended.

Lungs/Pleura: Images of the lung parenchyma shows no acute
infiltrate or pulmonary edema. No focal consolidation. No
bronchiectasis. No emphysema. No pneumothorax. Central airways are
patent.

Upper Abdomen: The visualized upper abdomen shows no adrenal gland
mass. Unenhanced liver is unremarkable. No calcified gallstones are
noted within gallbladder. Unenhanced pancreas and spleen are
unremarkable. Colonic diverticula are noted splenic flexure of the
colon.

Musculoskeletal: No destructive bony lesions are noted. Sagittal
images of the spine shows degenerative changes thoracic spine.
Sagittal view of the sternum is unremarkable.
IMPRESSION: 1. Question nodule in left lobe of thyroid gland medially measures
1.6 cm. Further correlation with thyroid gland ultrasound is
recommended.
2. Atherosclerotic calcifications of thoracic aorta and coronary
arteries.
3. No infiltrate or pulmonary edema.
4. No mediastinal hematoma or adenopathy.
5. Degenerative changes thoracic spine.

## 2017-08-05 ENCOUNTER — Other Ambulatory Visit (HOSPITAL_COMMUNITY): Payer: Self-pay | Admitting: Pulmonary Disease

## 2017-08-05 DIAGNOSIS — I82402 Acute embolism and thrombosis of unspecified deep veins of left lower extremity: Secondary | ICD-10-CM

## 2017-08-10 ENCOUNTER — Ambulatory Visit (HOSPITAL_COMMUNITY)
Admission: RE | Admit: 2017-08-10 | Discharge: 2017-08-10 | Disposition: A | Discharge status: Home / Self Care | Payer: BLUE CROSS/BLUE SHIELD | Source: Ambulatory Visit | Attending: Pulmonary Disease | Admitting: Pulmonary Disease

## 2017-08-10 DIAGNOSIS — I82402 Acute embolism and thrombosis of unspecified deep veins of left lower extremity: Secondary | ICD-10-CM | POA: Diagnosis present

## 2017-12-08 ENCOUNTER — Other Ambulatory Visit: Payer: Self-pay

## 2017-12-08 ENCOUNTER — Emergency Department (HOSPITAL_COMMUNITY)
Admission: EM | Admit: 2017-12-08 | Discharge: 2017-12-08 | Disposition: A | Discharge status: Home / Self Care | Payer: BLUE CROSS/BLUE SHIELD | Attending: Emergency Medicine | Admitting: Emergency Medicine

## 2017-12-08 ENCOUNTER — Encounter (HOSPITAL_COMMUNITY): Payer: Self-pay | Admitting: Emergency Medicine

## 2017-12-08 DIAGNOSIS — M79602 Pain in left arm: Secondary | ICD-10-CM | POA: Diagnosis not present

## 2017-12-08 DIAGNOSIS — J449 Chronic obstructive pulmonary disease, unspecified: Secondary | ICD-10-CM | POA: Diagnosis not present

## 2017-12-08 DIAGNOSIS — Y658 Other specified misadventures during surgical and medical care: Secondary | ICD-10-CM | POA: Insufficient documentation

## 2017-12-08 DIAGNOSIS — T82898A Other specified complication of vascular prosthetic devices, implants and grafts, initial encounter: Secondary | ICD-10-CM | POA: Insufficient documentation

## 2017-12-08 DIAGNOSIS — Z87891 Personal history of nicotine dependence: Secondary | ICD-10-CM | POA: Insufficient documentation

## 2017-12-08 DIAGNOSIS — Z79899 Other long term (current) drug therapy: Secondary | ICD-10-CM | POA: Diagnosis not present

## 2017-12-08 NOTE — ED Triage Notes (Signed)
Pt reports he gave blood this morning and phlebotomist fished for vein, left arm now has bruising, swelling and warm to touch, pt states he took blood thinners until 3 mos ago

## 2017-12-08 NOTE — Discharge Instructions (Signed)
As discussed, you have been scheduled for a formal ultrasound of your left arm to confirm your swelling is from a hematoma (under the skin) and not a blood clot in your vein. In the interim, continue using ice and elevation of the arm to help with pain and swelling.

## 2017-12-08 NOTE — ED Provider Notes (Signed)
[facility] EMERGENCY DEPARTMENT Provider Note   CSN: [id number] Arrival date & time: 12/08/17  2114     History   Chief Complaint Chief Complaint  Patient presents with  . Arm Pain    HPI James Harris is a 62 y.o. male with a history of COPD and a lower extremity dvt last year felt to be due from long periods of sedentary habits (stopped anticoagulation 3 months ago) presenting with left upper extremity pain, swelling and bruising after an attempt to give blood this morning (ended up giving blood in the right arm).  He has concerns about increased tenderness and swelling this evening and possibility of a dvt.  He has applied ice throughout the day to help reduce swelling.  The puncture site continued to ooze and finally stopped around 5 pm.    The history is provided by the patient.    Past Medical History:  Diagnosis Date  . COPD (chronic obstructive pulmonary disease) [facility])     Patient Active Problem List   Diagnosis Date Noted  . KNEE, ARTHRITIS, DEGEN./OSTEO 10/24/2008  . CHONDROMALACIA PATELLA 10/24/2008  . DERANGEMENT MENISCUS 08/23/2008    Past Surgical History:  Procedure Laterality Date  . KNEE SURGERY  bilateral  . TONSILLECTOMY          Home Medications    Prior to Admission medications   Medication Sig Start Date End Date Taking? Authorizing Provider  escitalopram (LEXAPRO) 10 MG tablet Take 10 mg by mouth daily. 11/27/17  Yes [provider]  predniSONE (DELTASONE) 10 MG tablet Take 10 mg by mouth daily. *Or take as directed by MD for breathing in titration courses as directed   Yes [provider]    Family History History reviewed. No pertinent family history.  Social History Social History   Tobacco Use  . Smoking status: Former Research scientist (life sciences)  . Smokeless tobacco: Never Used  Substance Use Topics  . Alcohol use: Yes    Comment: rarely  . Drug use: No     Allergies   Patient has no known allergies.   Review of  Systems Review of Systems  Constitutional: Negative for fever.  HENT: Negative for congestion and sore throat.   Eyes: Negative.   Respiratory: Negative for chest tightness and shortness of breath.   Cardiovascular: Negative for chest pain.  Gastrointestinal: Negative for nausea and vomiting.  Genitourinary: Negative.   Musculoskeletal: Positive for arthralgias. Negative for joint swelling and neck pain.  Skin: Positive for color change. Negative for rash and wound.  Neurological: Negative for dizziness, weakness, light-headedness, numbness and headaches.  Psychiatric/Behavioral: Negative.      Physical Exam Updated Vital Signs BP (!) 156/80 (BP Location: Right Wrist)   Pulse 96   Temp 98.2 F (36.8 C) (Oral)   Resp 18   Ht 6' (1.829 m)   Wt 108.9 kg   SpO2 97%   BMI 32.55 kg/m   Physical Exam  Constitutional: He appears well-developed and well-nourished.  HENT:  Head: Normocephalic and atraumatic.  Cardiovascular: Normal rate, regular rhythm and intact distal pulses.  Pulses:      Radial pulses are 2+ on the right side, and 2+ on the left side.  Pulmonary/Chest: Effort normal and breath sounds normal.  Neurological: He is alert.  Skin: Skin is warm and dry.  Large area of ecchymosis at the left antecubital fossa with involvement of bruising extending from proximal forearm to mid medial upper arm.  Edema noted at proximal edge of  bruising.   No edema in lower arm.   Psychiatric: He has a normal mood and affect.  Nursing note and vitals reviewed.    ED Treatments / Results  Labs (all labs ordered are listed, but only abnormal results are displayed) Labs Reviewed - No data to display  EKG None  Radiology No results found.  Procedures Procedures (including critical care time)  Medications Ordered in ED Medications - No data to display   Initial Impression / Assessment and Plan / ED Course  I have reviewed the triage vital signs and the nursing  notes.  Pertinent labs & imaging results that were available during my care of the patient were reviewed by me and considered in my medical decision making (see chart for details).     Pt with large area of ecchymosis and suspected hematoma left arm after several IV attempts to donate blood this am.  Doubt dvt, but with pt's past hx this will need to be ruled out.  No sob, no cp.  [place] ordered for am here.  Pt will call for appt. And further tx pending these results.  Advised elevation, ice.    Final Clinical Impressions(s) / ED Diagnoses   Final diagnoses:  Left arm pain  Extravasation injury of IV catheter site with other complication, initial encounter [facility])    ED Discharge Orders         Ordered    US Venous Img Upper Uni Left     12/08/17 2226           Evalee Jefferson, PA-C 12/08/17 2335    Julianne Rice, MD 12/09/17 [phone]

## 2018-04-01 ENCOUNTER — Encounter (INDEPENDENT_AMBULATORY_CARE_PROVIDER_SITE_OTHER): Payer: Self-pay | Admitting: Internal Medicine

## 2018-05-26 ENCOUNTER — Ambulatory Visit (INDEPENDENT_AMBULATORY_CARE_PROVIDER_SITE_OTHER): Payer: Self-pay | Admitting: Internal Medicine

## 2018-05-28 ENCOUNTER — Other Ambulatory Visit: Payer: Self-pay

## 2018-05-28 ENCOUNTER — Encounter (HOSPITAL_COMMUNITY): Payer: Self-pay | Admitting: Emergency Medicine

## 2018-05-28 ENCOUNTER — Emergency Department (HOSPITAL_COMMUNITY)
Admission: EM | Admit: 2018-05-28 | Discharge: 2018-05-28 | Disposition: A | Discharge status: Home / Self Care | Payer: BLUE CROSS/BLUE SHIELD | Attending: Emergency Medicine | Admitting: Emergency Medicine

## 2018-05-28 ENCOUNTER — Emergency Department (HOSPITAL_COMMUNITY): Payer: BLUE CROSS/BLUE SHIELD

## 2018-05-28 DIAGNOSIS — Z20828 Contact with and (suspected) exposure to other viral communicable diseases: Secondary | ICD-10-CM | POA: Insufficient documentation

## 2018-05-28 DIAGNOSIS — J441 Chronic obstructive pulmonary disease with (acute) exacerbation: Secondary | ICD-10-CM

## 2018-05-28 DIAGNOSIS — Z87891 Personal history of nicotine dependence: Secondary | ICD-10-CM | POA: Diagnosis not present

## 2018-05-28 DIAGNOSIS — Z79899 Other long term (current) drug therapy: Secondary | ICD-10-CM | POA: Diagnosis not present

## 2018-05-28 DIAGNOSIS — R0602 Shortness of breath: Secondary | ICD-10-CM | POA: Diagnosis present

## 2018-05-28 LAB — CBC WITH DIFFERENTIAL/PLATELET
Abs Immature Granulocytes: 0.05 10*3/uL (ref 0.00–0.07)
Basophils Absolute: 0 10*3/uL (ref 0.0–0.1)
Basophils Relative: 0 %
Eosinophils Absolute: 0 10*3/uL (ref 0.0–0.5)
Eosinophils Relative: 0 %
HCT: 45 % (ref 39.0–52.0)
Hemoglobin: 15.7 g/dL (ref 13.0–17.0)
Immature Granulocytes: 1 %
Lymphocytes Relative: 14 %
Lymphs Abs: 1.3 10*3/uL (ref 0.7–4.0)
MCH: 32.7 pg (ref 26.0–34.0)
MCHC: 34.9 g/dL (ref 30.0–36.0)
MCV: 93.8 fL (ref 80.0–100.0)
Monocytes Absolute: 0.7 10*3/uL (ref 0.1–1.0)
Monocytes Relative: 7 %
Neutro Abs: 7.2 10*3/uL (ref 1.7–7.7)
Neutrophils Relative %: 78 %
Platelets: 238 10*3/uL (ref 150–400)
RBC: 4.8 MIL/uL (ref 4.22–5.81)
RDW: 12.8 % (ref 11.5–15.5)
WBC: 9.2 10*3/uL (ref 4.0–10.5)
nRBC: 0 % (ref 0.0–0.2)

## 2018-05-28 LAB — COMPREHENSIVE METABOLIC PANEL
ALT: 25 U/L (ref 0–44)
AST: 18 U/L (ref 15–41)
Albumin: 3.8 g/dL (ref 3.5–5.0)
Alkaline Phosphatase: 79 U/L (ref 38–126)
Anion gap: 9 (ref 5–15)
BUN: 16 mg/dL (ref 8–23)
CO2: 22 mmol/L (ref 22–32)
Calcium: 8.8 mg/dL — ABNORMAL LOW (ref 8.9–10.3)
Chloride: 107 mmol/L (ref 98–111)
Creatinine, Ser: 1.03 mg/dL (ref 0.61–1.24)
GFR calc Af Amer: >60 mL/min (ref >60)
GFR calc non Af Amer: >60 mL/min (ref >60)
Glucose, Bld: 130 mg/dL — ABNORMAL HIGH (ref 70–99)
Potassium: 3.8 mmol/L (ref 3.5–5.1)
Sodium: 138 mmol/L (ref 135–145)
Total Bilirubin: 0.6 mg/dL (ref 0.3–1.2)
Total Protein: 6.6 g/dL (ref 6.5–8.1)

## 2018-05-28 LAB — SARS CORONAVIRUS 2 BY RT PCR (HOSPITAL ORDER, PERFORMED IN ~~LOC~~ HOSPITAL LAB): SARS Coronavirus 2: NEGATIVE

## 2018-05-28 LAB — BRAIN NATRIURETIC PEPTIDE: B Natriuretic Peptide: 36 pg/mL (ref 0.0–100.0)

## 2018-05-28 LAB — TROPONIN I: Troponin I: <0.03 ng/mL (ref <0.03)

## 2018-05-28 MED ORDER — ALBUTEROL SULFATE (2.5 MG/3ML) 0.083% IN NEBU
5.0000 mg | INHALATION_SOLUTION | Freq: Once | RESPIRATORY_TRACT | Status: AC
  Administered 2018-05-28: 5 mg via RESPIRATORY_TRACT
  Filled 2018-05-28: qty 6

## 2018-05-28 MED ORDER — DEXAMETHASONE SODIUM PHOSPHATE 10 MG/ML IJ SOLN
10.0000 mg | Freq: Once | INTRAMUSCULAR | Status: AC
  Administered 2018-05-28: 10 mg via INTRAMUSCULAR
  Filled 2018-05-28: qty 1

## 2018-05-28 MED ORDER — DEXAMETHASONE SODIUM PHOSPHATE 10 MG/ML IJ SOLN: 10.0000 mg | Freq: Once | INTRAMUSCULAR | Status: DC

## 2018-05-28 MED ORDER — LEVALBUTEROL TARTRATE 45 MCG/ACT IN AERO: 1.0000 | INHALATION_SPRAY | Freq: Three times a day (TID) | RESPIRATORY_TRACT | 0 refills | Status: DC

## 2018-05-28 MED ORDER — LEVALBUTEROL HCL 0.63 MG/3ML IN NEBU: 0.6300 mg | INHALATION_SOLUTION | Freq: Four times a day (QID) | RESPIRATORY_TRACT | Status: DC

## 2018-05-28 NOTE — ED Notes (Signed)
ED Provider at bedside. 

## 2018-05-28 NOTE — ED Provider Notes (Signed)
[facility] EMERGENCY DEPARTMENT Provider Note   CSN: [id number] Arrival date & time: 05/28/18  1631    History   Chief Complaint Chief Complaint  Patient presents with   Shortness of Breath    HPI James Harris is a 63 y.o. male.     HPI Patient with COPD presents with increasing dyspnea. Patient states that he is otherwise generally well, though he notes he has frequent episodes of breathing difficulty. He is currently using both prednisone and Levaquin for an exacerbation began several days ago. In spite of these medications, patient notes that he is worse, with increasing fatigue, increasing dyspnea, and new night sweats.  No objective fever, no vomiting, no diarrhea, no focal pain. He has been using albuterol, as well, intermittently, without substantial change in his condition. Today after speaking with his physician he was sent here for evaluation. Past Medical History:  Diagnosis Date   COPD (chronic obstructive pulmonary disease) [facility])     Patient Active Problem List   Diagnosis Date Noted   KNEE, ARTHRITIS, DEGEN./OSTEO 10/24/2008   CHONDROMALACIA PATELLA 10/24/2008   DERANGEMENT MENISCUS 08/23/2008    Past Surgical History:  Procedure Laterality Date   KNEE SURGERY  bilateral   TONSILLECTOMY          Home Medications    Prior to Admission medications   Medication Sig Start Date End Date Taking? Authorizing Provider  escitalopram (LEXAPRO) 10 MG tablet Take 10 mg by mouth daily. 11/27/17   [provider]  predniSONE (DELTASONE) 10 MG tablet Take 10 mg by mouth daily. *Or take as directed by MD for breathing in titration courses as directed    [provider]    Family History History reviewed. No pertinent family history.  Social History Social History   Tobacco Use   Smoking status: Former Smoker   Smokeless tobacco: Never Used  Substance Use Topics   Alcohol use: Yes    Comment: rarely   Drug use: No      Allergies   Patient has no known allergies.   Review of Systems Review of Systems  Constitutional:       Per HPI, otherwise negative  HENT:       Per HPI, otherwise negative  Respiratory:       Per HPI, otherwise negative  Cardiovascular:       Per HPI, otherwise negative  Gastrointestinal: Negative for vomiting.  Endocrine:       Negative aside from HPI  Genitourinary:       Neg aside from HPI   Musculoskeletal:       Per HPI, otherwise negative  Skin: Negative.   Neurological: Negative for syncope.     Physical Exam Updated Vital Signs BP (!) 168/80    Pulse 71    Temp 98 F (36.7 C) (Oral)    Resp 20    Ht 6' (1.829 m)    Wt 106.6 kg    SpO2 95%    BMI 31.87 kg/m   Physical Exam Vitals signs and nursing note reviewed.  Constitutional:      General: He is not in acute distress.    Appearance: He is well-developed.  HENT:     Head: Normocephalic and atraumatic.  Eyes:     Conjunctiva/sclera: Conjunctivae normal.  Cardiovascular:     Rate and Rhythm: Normal rate and regular rhythm.  Pulmonary:     Effort: Tachypnea present.     Breath sounds: Decreased breath sounds present. No  wheezing.  Abdominal:     General: There is no distension.  Skin:    General: Skin is warm and dry.  Neurological:     Mental Status: He is alert and oriented to person, place, and time.      ED Treatments / Results  Labs (all labs ordered are listed, but only abnormal results are displayed) Labs Reviewed  COMPREHENSIVE METABOLIC PANEL - Abnormal; Notable for the following components:      Result Value   Glucose, Bld 130 (*)    Calcium 8.8 (*)    All other components within normal limits  SARS CORONAVIRUS 2 (HOSPITAL ORDER, [place] [facility])  CBC WITH DIFFERENTIAL/PLATELET  TROPONIN I  BRAIN NATRIURETIC PEPTIDE    EKG None  Radiology Dg Chest 2 View  Result Date: 05/28/2018 CLINICAL DATA:  Shortness of breath with activity. EXAM: CHEST -  2 VIEW COMPARISON:  PA and lateral chest 02/26/2016.  CT chest 11/19/2015. FINDINGS: Lungs clear. Heart size normal. Aortic atherosclerosis noted. No pneumothorax or pleural fluid. No acute or focal bony abnormality. IMPRESSION: No acute disease. Atherosclerosis. Electronically Signed   By: Inge Rise M.D.   On: 05/28/2018 17:58    Procedures Procedures (including critical care time)  Medications Ordered in ED Medications  levalbuterol (XOPENEX HFA) inhaler 1 puff (has no administration in time range)  albuterol (PROVENTIL) (2.5 MG/3ML) 0.083% nebulizer solution 5 mg (5 mg Nebulization Given 05/28/18 1939)  dexamethasone (DECADRON) injection 10 mg (10 mg Intramuscular Given 05/28/18 1939)     Initial Impression / Assessment and Plan / ED Course  I have reviewed the triage vital signs and the nursing notes.  Pertinent labs & imaging results that were available during my care of the patient were reviewed by me and considered in my medical decision making (see chart for details).        8:34 PM Patient looks more comfortable, states that he feels somewhat better following albuterol, Decadron We have previously reviewed the patient's x-ray, labs, notable for no evidence for pneumonia, negative COVID test. With no hypoxia, no persistent tachypnea, clear lung sounds, low suspicion for occult pneumonia We lengthy conversation about admission for further management of likely COPD exacerbation versus discharge with close outpatient follow-up.  Patient is a strong preference for the latter option, notes that his wife is ill at home. We adjusted the patient's medications, to use Xopenex rather than albuterol given concern for palpitations and anxiety that he has with the latter.  Patient discharged in stable condition.  Final Clinical Impressions(s) / ED Diagnoses   Final diagnoses:  COPD exacerbation ([place])      Carmin Muskrat, MD 05/28/18 2035

## 2018-05-28 NOTE — ED Triage Notes (Signed)
Sent by PCP, c/o increasing SOB.  SOB with activity.  Denies fever but have been having night sweats.  Denies chest pain.

## 2018-05-28 NOTE — Discharge Instructions (Addendum)
Please use the Xopenex three times daily.  (Also take your existing medications.)  Return her for concerning changes in your condition.

## 2018-09-06 ENCOUNTER — Encounter: Payer: Self-pay | Admitting: Orthopedic Surgery

## 2018-09-06 ENCOUNTER — Ambulatory Visit: Payer: BC Managed Care – PPO | Admitting: Orthopedic Surgery

## 2018-09-06 ENCOUNTER — Ambulatory Visit: Payer: BC Managed Care – PPO

## 2018-09-06 ENCOUNTER — Other Ambulatory Visit: Payer: Self-pay

## 2018-09-06 VITALS — BP 159/106 | HR 92 | Ht 72.0 in | Wt 235.0 lb

## 2018-09-06 DIAGNOSIS — R202 Paresthesia of skin: Secondary | ICD-10-CM | POA: Diagnosis not present

## 2018-09-06 DIAGNOSIS — G8929 Other chronic pain: Secondary | ICD-10-CM

## 2018-09-06 DIAGNOSIS — M25512 Pain in left shoulder: Secondary | ICD-10-CM | POA: Diagnosis not present

## 2018-09-06 MED ORDER — MELOXICAM 7.5 MG PO TABS: 7.5000 mg | ORAL_TABLET | Freq: Every day | ORAL | 5 refills | Status: DC

## 2018-09-06 NOTE — Patient Instructions (Addendum)
You have received an injection of steroids into the joint. 15% of patients will have increased pain within the 24 hours postinjection.   This is transient and will go away.   We recommend that you use ice packs on the injection site for 20 minutes every 2 hours and extra strength Tylenol 2 tablets every 8 as needed until the pain resolves.  If you continue to have pain after taking the Tylenol and using the ice please call the office for further instructions.   Carpal Tunnel Syndrome  Carpal tunnel syndrome is a condition that causes pain in your hand and arm. The carpal tunnel is a narrow area that is on the palm side of your wrist. Repeated wrist motion or certain diseases may cause swelling in the tunnel. This swelling can pinch the main nerve in the wrist (median nerve). What are the causes? This condition may be caused by:  Repeated wrist motions.  Wrist injuries.  Arthritis.  A sac of fluid (cyst) or abnormal growth (tumor) in the carpal tunnel.  Fluid buildup during pregnancy. Sometimes the cause is not known. What increases the risk? The following factors may make you more likely to develop this condition:  Having a job in which you move your wrist in the same way many times. This includes jobs like being a Software engineer or a Scientist, water quality.  Being a woman.  Having other health conditions, such as: ? Diabetes. ? Obesity. ? A thyroid gland that is not active enough (hypothyroidism). ? Kidney failure. What are the signs or symptoms? Symptoms of this condition include:  A tingling feeling in your fingers.  Tingling or a loss of feeling (numbness) in your hand.  Pain in your entire arm. This pain may get worse when you bend your wrist and elbow for a long time.  Pain in your wrist that goes up your arm to your shoulder.  Pain that goes down into your palm or fingers.  A weak feeling in your hands. You may find it hard to grab and hold items. You may feel worse at night. How  is this diagnosed? This condition is diagnosed with a medical history and physical exam. You may also have tests, such as:  Electromyogram (EMG). This test checks the signals that the nerves send to the muscles.  Nerve conduction study. This test checks how well signals pass through your nerves.  Imaging tests, such as X-rays, ultrasound, and MRI. These tests check for what might be the cause of your condition. How is this treated? This condition may be treated with:  Lifestyle changes. You will be asked to stop or change the activity that caused your problem.  Doing exercise and activities that make bones and muscles stronger (physical therapy).  Learning how to use your hand again (occupational therapy).  Medicines for pain and swelling (inflammation). You may have injections in your wrist.  A wrist splint.  Surgery. Follow these instructions at home: If you have a splint:  Wear the splint as told by your doctor. Remove it only as told by your doctor.  Loosen the splint if your fingers: ? Tingle. ? Lose feeling (become numb). ? Turn cold and blue.  Keep the splint clean.  If the splint is not waterproof: ? Do not let it get wet. ? Cover it with a watertight covering when you take a bath or a shower. Managing pain, stiffness, and swelling   If told, put ice on the painful area: ? If you have a  removable splint, remove it as told by your doctor. ? Put ice in a plastic bag. ? Place a towel between your skin and the bag. ? Leave the ice on for 20 minutes, 2-3 times per day. General instructions  Take over-the-counter and prescription medicines only as told by your doctor.  Rest your wrist from any activity that may cause pain. If needed, talk with your boss at work about changes that can help your wrist heal.  Do any exercises as told by your doctor, physical therapist, or occupational therapist.  Keep all follow-up visits as told by your doctor. This is  important. Contact a doctor if:  You have new symptoms.  Medicine does not help your pain.  Your symptoms get worse. Get help right away if:  You have very bad numbness or tingling in your wrist or hand. Summary  Carpal tunnel syndrome is a condition that causes pain in your hand and arm.  It is often caused by repeated wrist motions.  Lifestyle changes and medicines are used to treat this problem. Surgery may help in very bad cases.  Follow your doctor's instructions about wearing a splint, resting your wrist, keeping follow-up visits, and calling for help. This information is not intended to replace advice given to you by your health care provider. Make sure you discuss any questions you have with your health care provider. Document Released: 12/19/2010 Document Revised: 05/08/2017 Document Reviewed: 05/08/2017 Elsevier Patient Education  2020 [place].  Shoulder Impingement Syndrome  Shoulder impingement syndrome is a condition that causes pain when connective tissues (tendons) surrounding the shoulder joint become pinched. These tendons are part of the group of muscles and tissues that help to stabilize the shoulder (rotator cuff). Beneath the rotator cuff is a fluid-filled sac (bursa) that allows the muscles and tendons to glide smoothly. The bursa may become swollen or irritated (bursitis). Bursitis, swelling in the rotator cuff tendons, or both conditions can decrease how much space is under a bone in the shoulder joint (acromion), resulting in impingement. What are the causes? Shoulder impingement syndrome may be caused by bursitis or swelling of the rotator cuff tendons, which may result from:  Repetitive overhead arm movements.  Falling onto the shoulder.  Weakness in the shoulder muscles. What increases the risk? You may be more likely to develop this condition if you:  Play sports that involve throwing, such as baseball.  Participate in sports such as tennis,  volleyball, and swimming.  Work as a Curator, Games developer, or Architect. Some people are also more likely to develop impingement syndrome because of the shape of their acromion bone. What are the signs or symptoms? The main symptom of this condition is pain on the front or side of the shoulder. The pain may:  Get worse when lifting or raising the arm.  Get worse at night.  Wake you up from sleeping.  Feel sharp when the shoulder is moved and then fade to an ache. Other symptoms may include:  Tenderness.  Stiffness.  Inability to raise the arm above shoulder level or behind the body.  Weakness. How is this diagnosed? This condition may be diagnosed based on:  Your symptoms and medical history.  A physical exam.  Imaging tests, such as: ? X-rays. ? MRI. ? Ultrasound. How is this treated? This condition may be treated by:  Resting your shoulder and avoiding all activities that cause pain or put stress on the shoulder.  Icing your shoulder.  NSAIDs to help reduce  pain and swelling.  One or more injections of medicines to numb the area and reduce inflammation.  Physical therapy.  Surgery. This may be needed if nonsurgical treatments have not helped. Surgery may involve repairing the rotator cuff, reshaping the acromion, or removing the bursa. Follow these instructions at home: Managing pain, stiffness, and swelling   If directed, put ice on the injured area. ? Put ice in a plastic bag. ? Place a towel between your skin and the bag. ? Leave the ice on for 20 minutes, 2-3 times a day. Activity  Rest and return to your normal activities as told by your health care provider. Ask your health care provider what activities are safe for you.  Do exercises as told by your health care provider. General instructions  Do not use any products that contain nicotine or tobacco, such as cigarettes, e-cigarettes, and chewing tobacco. These can delay healing. If you need help  quitting, ask your health care provider.  Ask your health care provider when it is safe for you to drive.  Take over-the-counter and prescription medicines only as told by your health care provider.  Keep all follow-up visits as told by your health care provider. This is important. How is this prevented?  Give your body time to rest between periods of activity.  Be safe and responsible while being active. This will help you avoid falls.  Maintain physical fitness, including strength and flexibility. Contact a health care provider if:  Your symptoms have not improved after 1-2 months of treatment and rest.  You cannot lift your arm away from your body. Summary  Shoulder impingement syndrome is a condition that causes pain when connective tissues (tendons) surrounding the shoulder joint become pinched.  The main symptom of this condition is pain on the front or side of the shoulder.  This condition is usually treated with rest, ice, and pain medicines as needed. This information is not intended to replace advice given to you by your health care provider. Make sure you discuss any questions you have with your health care provider. Document Released: 12/30/2004 Document Revised: 04/23/2018 Document Reviewed: 06/24/2017 Elsevier Patient Education  2020 [employer].

## 2018-09-06 NOTE — Progress Notes (Signed)
James Harris  09/06/2018  HISTORY SECTION :  Chief Complaint  Patient presents with  . Shoulder Pain    left   HPI The patient presents for evaluation of painful left shoulder x6 weeks no injury associated pain left shoulder with numbness and tingling left hand especially when he is driving his motorcycle  He tried ibuprofen no improvement  He notes no weakness pain is moderate No other treatment.  Not improving. Review of Systems  Constitutional: Positive for diaphoresis.  Respiratory: Positive for shortness of breath and wheezing.   Endo/Heme/Allergies: Bruises/bleeds easily.  All other systems reviewed and are negative.    Past Medical History:  Diagnosis Date  . COPD (chronic obstructive pulmonary disease) ([place])     Past Surgical History:  Procedure Laterality Date  . KNEE SURGERY  bilateral  . TONSILLECTOMY       No Known Allergies   Current Outpatient Medications:  .  Cholecalciferol (VITAMIN D) 50 MCG (2000 UT) tablet, Take 2,000 Units by mouth daily., Disp: , Rfl:  .  escitalopram (LEXAPRO) 10 MG tablet, Take 10 mg by mouth daily., Disp: , Rfl: 12 .  predniSONE (DELTASONE) 10 MG tablet, Take 10 mg by mouth daily. *Or take as directed by MD for breathing in titration courses as directed, Disp: , Rfl:  .  levalbuterol (XOPENEX HFA) 45 MCG/ACT inhaler, Inhale 1 puff into the lungs 3 (three) times daily for 4 days. (Patient not taking: Reported on 09/06/2018), Disp: 1 Inhaler, Rfl: 0 .  meloxicam (MOBIC) 7.5 MG tablet, Take 1 tablet (7.5 mg total) by mouth daily., Disp: 30 tablet, Rfl: 5   PHYSICAL EXAM SECTION: 1) BP (!) 159/106   Pulse 92   Ht 6' (1.829 m)   Wt 235 lb (106.6 kg)   BMI 31.87 kg/m   Body mass index is 31.87 kg/m. General appearance: Well-developed well-nourished no gross deformities  2) Cardiovascular normal pulse and perfusion in all 4 extremities normal color without edema  3) Neurologically deep tendon reflexes are equal and  normal, no sensation loss or deficits no pathologic reflexes  4) Psychological: Awake alert and oriented x3 mood and affect normal  5) Skin no lacerations or ulcerations no nodularity no palpable masses, no erythema or nodularity  6) Musculoskeletal: Right shoulder no tenderness normal range of motion shoulder stable muscle tone and strength normal  Left shoulder tenderness painful range of motion no instability normal muscle strength and muscle tone positive impingement sign  Normal reflexes normal sensation to soft touch  Neck normal range of motion no tenderness  MEDICAL DECISION SECTION:  Encounter Diagnoses  Name Primary?  . Chronic left shoulder pain Yes  . Paresthesias in left hand     Imaging Neck x-ray is noted for degenerative disc disease and spondylosis see report  Left shoulder x-ray looks normal  Probable impingement syndrome left shoulder recommend subacromial injection  Recommend left carpal tunnel testing with nerve conduction study  Start rotator cuff strengthening program  Call patient with results  Start meloxicam for shoulder pain  Procedure note the subacromial injection shoulder left   Verbal consent was obtained to inject the  Left   Shoulder  Timeout was completed to confirm the injection site is a subacromial space of the  left  shoulder  Medication used Depo-Medrol 40 mg and lidocaine 1% 3 cc  Anesthesia was provided by ethyl chloride  The injection was performed in the left  posterior subacromial space. After pinning the skin with alcohol and  anesthetized the skin with ethyl chloride the subacromial space was injected using a 20-gauge needle. There were no complications  Sterile dressing was applied.   Plan:  (Rx., Inj., surg., Frx, MRI/CT, XR:2)    12:42 PM [name], MD  09/06/2018

## 2018-09-22 ENCOUNTER — Ambulatory Visit (INDEPENDENT_AMBULATORY_CARE_PROVIDER_SITE_OTHER): Payer: BC Managed Care – PPO | Admitting: Physical Medicine and Rehabilitation

## 2018-09-22 ENCOUNTER — Encounter: Payer: Self-pay | Admitting: Physical Medicine and Rehabilitation

## 2018-09-22 ENCOUNTER — Other Ambulatory Visit: Payer: Self-pay

## 2018-09-22 DIAGNOSIS — R202 Paresthesia of skin: Secondary | ICD-10-CM

## 2018-09-22 NOTE — Progress Notes (Signed)
  Numeric Pain Rating Scale and Functional Assessment Average Pain 7   In the last MONTH (on 0-10 scale) has pain interfered with the following?  1. General activity like being  able to carry out your everyday physical activities such as walking, climbing stairs, carrying groceries, or moving a chair?  Rating(10)

## 2018-09-22 NOTE — Procedures (Signed)
EMG & NCV Findings: All nerve conduction studies (as indicated in the following tables) were within normal limits.    Needle evaluation of the left biceps muscle showed increased insertional activity.  All remaining muscles (as indicated in the following table) showed no evidence of electrical instability.    Impression: Essentially NORMAL electrodiagnostic study of the left upper limb.  There is no significant electrodiagnostic evidence of nerve entrapment or brachial plexopathy or cervical radiculopathy.    As you know, purely sensory or demyelinating radiculopathies and chemical radiculitis may not be detected with this particular electrodiagnostic study.**Clinically this seems to be more related to the cervical spine.  There is the very mild this increased insertional activity in the bicep which is really hard to distinguish if it is anything real.  Would suggest MRI of the cervical spine.  Recommendations: 1.  Follow-up with referring physician. 2.  Continue current management of symptoms. 3.  Suggest cervical MRI.  ___________________________ Laurence Spates FAAPMR Board Certified, American Board of Physical Medicine and Rehabilitation    Nerve Conduction Studies Anti Sensory Summary Table   Stim Site NR Peak (ms) Norm Peak (ms) P-T Amp (V) Norm P-T Amp Site1 Site2 Delta-P (ms) Dist (cm) Vel (m/s) Norm Vel (m/s)  Left Median Acr Palm Anti Sensory (2nd Digit)  33.5C  Wrist    3.6 <3.6 16.6 >10 Wrist Palm 1.8 0.0    Palm    1.8 <2.0 13.1         Left Radial Anti Sensory (Base 1st Digit)  33.2C  Wrist    2.1 <3.1 31.5  Wrist Base 1st Digit 2.1 0.0    Left Ulnar Anti Sensory (5th Digit)  33.5C  Wrist    3.3 <3.7 19.6 >15.0 Wrist 5th Digit 3.3 14.0 42 >38   Motor Summary Table   Stim Site NR Onset (ms) Norm Onset (ms) O-P Amp (mV) Norm O-P Amp Site1 Site2 Delta-0 (ms) Dist (cm) Vel (m/s) Norm Vel (m/s)  Left Median Motor (Abd Poll Brev)  33C  Wrist    3.4 <4.2 6.3 >5 Elbow  Wrist 4.1 22.5 55 >50  Elbow    7.5  5.9         Left Ulnar Motor (Abd Dig Min)  33C  Wrist    2.9 <4.2 7.6 >3 B Elbow Wrist 3.4 21.0 62 >53  B Elbow    6.3  7.0  A Elbow B Elbow 1.2 10.0 83 >53  A Elbow    7.5  6.7          EMG   Side Muscle Nerve Root Ins Act Fibs Psw Amp Dur Poly Recrt Int Fraser Din Comment  Left Abd Poll Brev Median C8-T1 Nml Nml Nml Nml Nml 0 Nml Nml   Left 1stDorInt Ulnar C8-T1 Nml Nml Nml Nml Nml 0 Nml Nml   Left PronatorTeres Median C6-7 Nml Nml Nml Nml Nml 0 Nml Nml   Left Biceps Musculocut C5-6 *Incr Nml Nml Nml Nml 0 Nml Nml   Left Deltoid Axillary C5-6 Nml Nml Nml Nml Nml 0 Nml Nml   Left Ext Digitorum  Radial (Post Int) C7-8 Nml Nml Nml Nml Nml 0 Nml Nml     Nerve Conduction Studies Anti Sensory Left/Right Comparison   Stim Site L Lat (ms) R Lat (ms) L-R Lat (ms) L Amp (V) R Amp (V) L-R Amp (%) Site1 Site2 L Vel (m/s) R Vel (m/s) L-R Vel (m/s)  Median Acr Palm Anti Sensory (2nd Digit)  33.[place]  Wrist 3.6   16.6   Wrist Palm     Palm 1.8   13.1         Radial Anti Sensory (Base 1st Digit)  33.2C  Wrist 2.1   31.5   Wrist Base 1st Digit     Ulnar Anti Sensory (5th Digit)  33.5C  Wrist 3.3   19.6   Wrist 5th Digit 42     Motor Left/Right Comparison   Stim Site L Lat (ms) R Lat (ms) L-R Lat (ms) L Amp (mV) R Amp (mV) L-R Amp (%) Site1 Site2 L Vel (m/s) R Vel (m/s) L-R Vel (m/s)  Median Motor (Abd Poll Brev)  33C  Wrist 3.4   6.3   Elbow Wrist 55    Elbow 7.5   5.9         Ulnar Motor (Abd Dig Min)  33C  Wrist 2.9   7.6   B Elbow Wrist 62    B Elbow 6.3   7.0   A Elbow B Elbow 83    A Elbow 7.5   6.7            Waveforms:

## 2018-09-22 NOTE — Progress Notes (Signed)
James Harris - 63 y.o. male MRN [id number]  Date of birth: 1955/12/20  Office Visit Note: Visit Date: 09/22/2018 PCP: Doree Albee, MD Referred by: Doree Albee, MD  Subjective: Chief Complaint  Patient presents with  . Left Hand - Numbness   HPI: James Harris is a 63 y.o. male who comes in today At the request of Dr. [name] for electrodiagnostic study of the left upper limb.  Patient is left-hand dominant with several months of progressive worsening numbness and tingling in the left arm.  He reports initially symptoms started in the first 3 digits but now some into the fourth digit as well.  The only time he really gets the symptoms is when he extends his cervical spine.  He has difficulty driving the car and riding motorcycle because he has to slump forward.  If he extends his neck backwards even today in the office he feels tingling paresthesias in the hand.  He does not really endorse nocturnal complaints.  He reports difficulty at night trying to get to sleep and cannot find a comfortable position.  He rates his pain as a 7 out of 10.  He said no prior electrodiagnostic studies.  He has had cervical spine x-ray which is detailed below.  He has not had cervical MRI or prior cervical surgery.  Denies any right-sided complaints.  ROS Otherwise per HPI.  Assessment & Plan: Visit Diagnoses:  1. Paresthesia of skin     Plan: Impression: Essentially NORMAL electrodiagnostic study of the left upper limb.  There is no significant electrodiagnostic evidence of nerve entrapment or brachial plexopathy or cervical radiculopathy.    As you know, purely sensory or demyelinating radiculopathies and chemical radiculitis may not be detected with this particular electrodiagnostic study.**Clinically this seems to be more related to the cervical spine.  There is the very mild this increased insertional activity in the bicep which is really hard to distinguish if it is anything  real.  Would suggest MRI of the cervical spine.  Recommendations: 1.  Follow-up with referring physician. 2.  Continue current management of symptoms. 3.  Suggest cervical MRI.  Meds & Orders: No orders of the defined types were placed in this encounter.   Orders Placed This Encounter  Procedures  . NCV with EMG (electromyography)    Follow-up: Return for [name], MD.   Procedures: No procedures performed  EMG & NCV Findings: All nerve conduction studies (as indicated in the following tables) were within normal limits.    Needle evaluation of the left biceps muscle showed increased insertional activity.  All remaining muscles (as indicated in the following table) showed no evidence of electrical instability.    Impression: Essentially NORMAL electrodiagnostic study of the left upper limb.  There is no significant electrodiagnostic evidence of nerve entrapment or brachial plexopathy or cervical radiculopathy.    As you know, purely sensory or demyelinating radiculopathies and chemical radiculitis may not be detected with this particular electrodiagnostic study.**Clinically this seems to be more related to the cervical spine.  There is the very mild this increased insertional activity in the bicep which is really hard to distinguish if it is anything real.  Would suggest MRI of the cervical spine.  Recommendations: 1.  Follow-up with referring physician. 2.  Continue current management of symptoms. 3.  Suggest cervical MRI.  ___________________________ James Harris [facility] Board Certified, American Board of Physical Medicine and Rehabilitation    Nerve Conduction Studies Anti Sensory Summary Table  Stim Site NR Peak (ms) Norm Peak (ms) P-T Amp (V) Norm P-T Amp Site1 Site2 Delta-P (ms) Dist (cm) Vel (m/s) Norm Vel (m/s)  Left Median Acr Palm Anti Sensory (2nd Digit)  33.5C  Wrist    3.6 <3.6 16.6 >10 Wrist Palm 1.8 0.0    Palm    1.8 <2.0 13.1         Left Radial  Anti Sensory (Base 1st Digit)  33.2C  Wrist    2.1 <3.1 31.5  Wrist Base 1st Digit 2.1 0.0    Left Ulnar Anti Sensory (5th Digit)  33.5C  Wrist    3.3 <3.7 19.6 >15.0 Wrist 5th Digit 3.3 14.0 42 >38   Motor Summary Table   Stim Site NR Onset (ms) Norm Onset (ms) O-P Amp (mV) Norm O-P Amp Site1 Site2 Delta-0 (ms) Dist (cm) Vel (m/s) Norm Vel (m/s)  Left Median Motor (Abd Poll Brev)  33C  Wrist    3.4 <4.2 6.3 >5 Elbow Wrist 4.1 22.5 55 >50  Elbow    7.5  5.9         Left Ulnar Motor (Abd Dig Min)  33C  Wrist    2.9 <4.2 7.6 >3 B Elbow Wrist 3.4 21.0 62 >53  B Elbow    6.3  7.0  A Elbow B Elbow 1.2 10.0 83 >53  A Elbow    7.5  6.7          EMG   Side Muscle Nerve Root Ins Act Fibs Psw Amp Dur Poly Recrt Int Fraser Din Comment  Left Abd Poll Brev Median C8-T1 Nml Nml Nml Nml Nml 0 Nml Nml   Left 1stDorInt Ulnar C8-T1 Nml Nml Nml Nml Nml 0 Nml Nml   Left PronatorTeres Median C6-7 Nml Nml Nml Nml Nml 0 Nml Nml   Left Biceps Musculocut C5-6 *Incr Nml Nml Nml Nml 0 Nml Nml   Left Deltoid Axillary C5-6 Nml Nml Nml Nml Nml 0 Nml Nml   Left Ext Digitorum  Radial (Post Int) C7-8 Nml Nml Nml Nml Nml 0 Nml Nml     Nerve Conduction Studies Anti Sensory Left/Right Comparison   Stim Site L Lat (ms) R Lat (ms) L-R Lat (ms) L Amp (V) R Amp (V) L-R Amp (%) Site1 Site2 L Vel (m/s) R Vel (m/s) L-R Vel (m/s)  Median Acr Palm Anti Sensory (2nd Digit)  33.5C  Wrist 3.6   16.6   Wrist Palm     Palm 1.8   13.1         Radial Anti Sensory (Base 1st Digit)  33.2C  Wrist 2.1   31.5   Wrist Base 1st Digit     Ulnar Anti Sensory (5th Digit)  33.5C  Wrist 3.3   19.6   Wrist 5th Digit 42     Motor Left/Right Comparison   Stim Site L Lat (ms) R Lat (ms) L-R Lat (ms) L Amp (mV) R Amp (mV) L-R Amp (%) Site1 Site2 L Vel (m/s) R Vel (m/s) L-R Vel (m/s)  Median Motor (Abd Poll Brev)  33C  Wrist 3.4   6.3   Elbow Wrist 55    Elbow 7.5   5.9         Ulnar Motor (Abd Dig Min)  33C  Wrist 2.9   7.6   B Elbow  Wrist 62    B Elbow 6.3   7.0   A Elbow B Elbow 83    A Elbow 7.5  6.7            Waveforms:            Clinical History: 2 view cervical spine 09/06/2018  CC:  left hand goes numb  AP and Lateral   Findings:   IMPRESSION:  Cervical spondylosis loss of cervical lordosis  Prominent disc space narrowing at C3 and 4 C4 and 5 anterior osteophyte with normal height and C5 and 6 disc space narrowing  Extensive cervical arthrosis is noted on the AP view as well  Impression cervical spondylosis moderate to severe   He reports that he has quit smoking. He has never used smokeless tobacco. No results for input(s): HGBA1C, LABURIC in the last 8760 hours.  Objective:  VS:  HT:    WT:   BMI:     BP:   HR: bpm  TEMP: ( )  RESP:  Physical Exam Musculoskeletal:        General: No tenderness.     Comments: Inspection reveals no atrophy of the bilateral APB or FDI or hand intrinsics. There is no swelling, color changes, allodynia or dystrophic changes. There is 5 out of 5 strength in the bilateral wrist extension, finger abduction and long finger flexion. There is intact sensation to light touch in all dermatomal and peripheral nerve distributions. There is a negative Hoffmann's test bilaterally.  Skin:    General: Skin is warm and dry.     Findings: No erythema or rash.  Neurological:     General: No focal deficit present.     Mental Status: He is alert and oriented to person, place, and time.     Sensory: No sensory deficit.     Motor: No weakness or abnormal muscle tone.     Coordination: Coordination normal.     Gait: Gait normal.  Psychiatric:        Mood and Affect: Mood normal.        Behavior: Behavior normal.        Thought Content: Thought content normal.     Ortho Exam Imaging: No results found.  Past Medical/Family/Surgical/Social History: Medications & Allergies reviewed per EMR, new medications updated. Patient Active Problem List   Diagnosis  Date Noted  . KNEE, ARTHRITIS, DEGEN./OSTEO 10/24/2008  . CHONDROMALACIA PATELLA 10/24/2008  . DERANGEMENT MENISCUS 08/23/2008   Past Medical History:  Diagnosis Date  . COPD (chronic obstructive pulmonary disease) ([place])    Family History  Family history unknown: Yes   Past Surgical History:  Procedure Laterality Date  . KNEE SURGERY  bilateral  . TONSILLECTOMY     Social History   Occupational History  . Not on file  Tobacco Use  . Smoking status: Former Research scientist (life sciences)  . Smokeless tobacco: Never Used  Substance and Sexual Activity  . Alcohol use: Yes    Comment: rarely  . Drug use: No  . Sexual activity: Not on file

## 2018-10-04 ENCOUNTER — Telehealth: Payer: Self-pay | Admitting: Orthopedic Surgery

## 2018-10-04 NOTE — Telephone Encounter (Signed)
Patient wants to know his tests results.  He said no one has called him.  He also said that his symptoms are getting worse.  Please call him.  Thanks

## 2018-10-04 NOTE — Telephone Encounter (Signed)
For these nerve results I need this in the note   Impression: Essentially NORMAL electrodiagnostic study of the left upper limb.  There is no significant electrodiagnostic evidence of nerve entrapment or brachial plexopathy or cervical radiculopathy.    As you know, purely sensory or demyelinating radiculopathies and chemical radiculitis may not be detected with this particular electrodiagnostic study.**Clinically this seems to be more related to the cervical spine.  There is the very mild this increased insertional activity in the bicep which is really hard to distinguish if it is anything real.  Would suggest MRI of the cervical spine.  Recommendations: 1.  Follow-up with referring physician. 2.  Continue current management of symptoms. 3.  Suggest cervical MRI.  ___________________________ Laurence Spates [facility] Board Certified, American Board of Physical Medicine and Rehabilitation

## 2018-10-05 ENCOUNTER — Other Ambulatory Visit: Payer: Self-pay | Admitting: Radiology

## 2018-10-05 ENCOUNTER — Telehealth: Payer: Self-pay | Admitting: Radiology

## 2018-10-05 DIAGNOSIS — R202 Paresthesia of skin: Secondary | ICD-10-CM

## 2018-10-05 NOTE — Telephone Encounter (Signed)
I spoke to patient about he does not meet requirements for the MRI scan with [employer], Dr Aline Brochure wants to see him in the office Friday, I left message for him to call back so I can work him in.

## 2018-10-05 NOTE — Telephone Encounter (Signed)
He will be here 10:20 on Friday, per Dr Aline Brochure, can you put him on schedule, for recheck on his neck?

## 2018-10-05 NOTE — Telephone Encounter (Signed)
MRI Cspine needed but patient does not meet criteria with [employer] since he has not had physical therapy. I called patient to advise, his insurance runs out end of the month.

## 2018-10-08 ENCOUNTER — Ambulatory Visit: Payer: BC Managed Care – PPO | Admitting: Orthopedic Surgery

## 2018-10-08 ENCOUNTER — Encounter: Payer: Self-pay | Admitting: Orthopedic Surgery

## 2018-10-08 ENCOUNTER — Other Ambulatory Visit: Payer: Self-pay

## 2018-10-08 VITALS — BP 134/82 | HR 79 | Temp 97.0°F | Wt 234.8 lb

## 2018-10-08 DIAGNOSIS — M792 Neuralgia and neuritis, unspecified: Secondary | ICD-10-CM

## 2018-10-08 NOTE — Progress Notes (Signed)
Chief Complaint  Patient presents with  . Shoulder Pain    left shoulder   . Neck Pain    left arm/ Dr Aline Brochure wants MRI need documentation    James Harris is 62 years old he was seen for neck and shoulder pain and was treated and scheduled for MRI which was declined and recommended for therapy however, since that time he has had increasing pain in his cervical spine with pain radiating down his arm with neck extension and now weakness in his left upper extremity Previous history HPI The patient presents for evaluation of painful left shoulder x6 weeks no injury associated pain left shoulder with numbness and tingling left hand especially when he is driving his motorcycle   He tried ibuprofen no improvement   No other treatment.  Not improving. Review of Systems  Constitutional: Positive for diaphoresis.  Respiratory: Positive for shortness of breath and wheezing.   Endo/Heme/Allergies: Bruises/bleeds easily.  All other systems reviewed and are negative.  BP 134/82   Pulse 79   Temp (!) 97 F (36.1 C)   Wt 234 lb 12.8 oz (106.5 kg)   BMI 31.84 kg/m   Reexamination reveals he indeed has a positive Spurling sign with tenderness at the base of the cervical spine decreased rotation flexion and extension with extension increasing weakness and numbness in the left upper extremity  His grip strength is good his wrist extension and biceps are weak reflexes are normal at C7 decreased at C5 and 6 on the right  Again recommend MRI to evaluate cervical spine for increasing pain numbness tingling weakness left upper extremity

## 2018-10-13 ENCOUNTER — Telehealth: Payer: Self-pay | Admitting: Orthopedic Surgery

## 2018-10-13 ENCOUNTER — Other Ambulatory Visit: Payer: Self-pay

## 2018-10-13 ENCOUNTER — Ambulatory Visit (HOSPITAL_COMMUNITY)
Admission: RE | Admit: 2018-10-13 | Discharge: 2018-10-13 | Disposition: A | Discharge status: Home / Self Care | Payer: BC Managed Care – PPO | Source: Ambulatory Visit | Attending: Orthopedic Surgery | Admitting: Orthopedic Surgery

## 2018-10-13 DIAGNOSIS — M792 Neuralgia and neuritis, unspecified: Secondary | ICD-10-CM | POA: Diagnosis not present

## 2018-10-13 NOTE — Telephone Encounter (Signed)
I called him to let him know self pay rate is $650, he said he is already at [facility]

## 2018-10-13 NOTE — Telephone Encounter (Signed)
Patient called back to relay that he will go to the MRI appointment scheduled for today, 10/13/18; states aware of his out of pocket amount ($1600.00) - said that since his insurance is effective only through tonight at 12:40midnight, he will have it done; if Dr Aline Brochure can call with results, he would appreciate.

## 2018-10-14 ENCOUNTER — Telehealth: Payer: Self-pay | Admitting: Orthopedic Surgery

## 2018-10-14 NOTE — Telephone Encounter (Signed)
Warnings about nerve problems and paralysis    [facility] :   C2-C3: Trace anterolisthesis. Mild right-sided uncovertebral hypertrophy without significant disc bulge. Mild right-sided facet degeneration. No significant canal or foraminal stenosis.   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 No stenosis.   C4-C5: Mild disc bulge with bilateral uncovertebral hypertrophy. Superimposed bilateral facet degeneration. No significant spinal stenosis. Moderate bilateral C5 foraminal narrowing.   C5-C6: Chronic intervertebral disc space narrowing with diffuse degenerative disc osteophyte. Prominent bilateral uncovertebral spurring, right slightly worse than left. Lobulated posterior disc osteophyte indents and effaces the ventral thecal sac with resultant moderate spinal stenosis. Mild flattening of the ventral cord without cord signal changes. Severe bilateral C6 foraminal narrowing, right slightly worse than left.   C6-C7: Left foraminal disc protrusion extends into the left neural foramen (series 6, image 26). Resultant severe left foraminal stenosis, potentially affecting the left C7 nerve root. Underlying minor disc bulge with uncovertebral hypertrophy without significant canal or right foraminal stenosis.   C7-T1:  Mild facet degeneration.  Otherwise unremarkable.   Visualized upper thoracic spine demonstrates no significant finding.   IMPRESSION: 1. Left foraminal disc protrusion at C6-7 with resultant severe left foraminal stenosis. Query left C7 radiculitis. 2. Degenerative disc osteophyte at C5-6 with resultant moderate canal, with severe bilateral C6 foraminal stenosis. 3. Moderate bilateral C5 foraminal narrowing related to disc bulge and facet hypertrophy.     Electronically Signed   By: Jeannine Boga M.D.   On: 10/13/2018 22:59

## 2018-11-16 ENCOUNTER — Telehealth: Payer: Self-pay | Admitting: Orthopedic Surgery

## 2018-11-16 DIAGNOSIS — M792 Neuralgia and neuritis, unspecified: Secondary | ICD-10-CM

## 2018-11-16 DIAGNOSIS — R202 Paresthesia of skin: Secondary | ICD-10-CM

## 2018-11-16 NOTE — Telephone Encounter (Signed)
Mr. Dawn called and stated that Dr. Aline Brochure called him with his MRI results.  Dr Aline Brochure then told him that we would refer him to a neurosurgeon.  His insurance has changed to Medicare and I have changed this in the computer.  He will bring me the card so that I can scan it into the computer.

## 2018-11-16 NOTE — Telephone Encounter (Signed)
I put in the referral, need the card before I can send it to  Them, as soon as you get it, will you please make sure I am aware>? Thanks.

## 2018-11-18 ENCOUNTER — Other Ambulatory Visit: Payer: Self-pay | Admitting: Neurosurgery

## 2018-11-19 ENCOUNTER — Other Ambulatory Visit (HOSPITAL_COMMUNITY)
Admission: RE | Admit: 2018-11-19 | Discharge: 2018-11-19 | Disposition: A | Discharge status: Home / Self Care | Payer: Medicare Other | Source: Ambulatory Visit | Attending: Neurosurgery | Admitting: Neurosurgery

## 2018-11-19 DIAGNOSIS — Z01812 Encounter for preprocedural laboratory examination: Secondary | ICD-10-CM | POA: Insufficient documentation

## 2018-11-19 DIAGNOSIS — Z20828 Contact with and (suspected) exposure to other viral communicable diseases: Secondary | ICD-10-CM | POA: Insufficient documentation

## 2018-11-20 LAB — NOVEL CORONAVIRUS, NAA (HOSP ORDER, SEND-OUT TO REF LAB; TAT 18-24 HRS): SARS-CoV-2, NAA: NOT DETECTED

## 2018-11-22 ENCOUNTER — Encounter (HOSPITAL_COMMUNITY): Payer: Self-pay | Admitting: *Deleted

## 2018-11-22 ENCOUNTER — Other Ambulatory Visit: Payer: Self-pay

## 2018-11-22 NOTE — Anesthesia Preprocedure Evaluation (Addendum)
Anesthesia Evaluation  Patient identified by MRN, date of birth, ID band Patient awake    Reviewed: Allergy & Precautions, NPO status , Patient's Chart, lab work & pertinent test results  Airway Mallampati: II  TM Distance: >3 FB Neck ROM: Full    Dental  (+) Dental Advisory Given, Teeth Intact   Pulmonary pneumonia, COPD, former smoker,    Pulmonary exam normal breath sounds clear to auscultation       Cardiovascular + Peripheral Vascular Disease  Normal cardiovascular exam Rhythm:Regular Rate:Normal     Neuro/Psych PSYCHIATRIC DISORDERS Depression negative neurological ROS     GI/Hepatic negative GI ROS, Neg liver ROS,   Endo/Other  negative endocrine ROS  Renal/GU negative Renal ROS     Musculoskeletal  (+) Arthritis ,   Abdominal   Peds  Hematology negative hematology ROS (+)   Anesthesia Other Findings   Reproductive/Obstetrics negative OB ROS                            Anesthesia Physical Anesthesia Plan  ASA: III  Anesthesia Plan: General   Post-op Pain Management:    Induction: Intravenous  PONV Risk Score and Plan: 3 and Ondansetron, Dexamethasone and Treatment may vary due to age or medical condition  Airway Management Planned: Oral ETT  Additional Equipment: None  Intra-op Plan:   Post-operative Plan: Extubation in OR  Informed Consent: I have reviewed the patients History and Physical, chart, labs and discussed the procedure including the risks, benefits and alternatives for the proposed anesthesia with the patient or authorized representative who has indicated his/her understanding and acceptance.     Dental advisory given  Plan Discussed with: CRNA  Anesthesia Plan Comments: (+/- glidescope)        Anesthesia Quick Evaluation

## 2018-11-22 NOTE — Progress Notes (Signed)
Spoke with pt for pre-op call. Pt denies cardiac history, HTN or diabetes.   Pt had Covid test done on 11/19/18 and it is negative. He states he's been in quarantine since having the test done.  Pt informed of Visitation policy and voiced understanding.

## 2018-11-23 ENCOUNTER — Encounter (HOSPITAL_COMMUNITY)
Admission: RE | Disposition: A | Discharge status: Home / Self Care | Payer: Self-pay | Source: Home / Self Care | Attending: Neurosurgery

## 2018-11-23 ENCOUNTER — Inpatient Hospital Stay (HOSPITAL_COMMUNITY): Payer: Medicare Other | Admitting: Anesthesiology

## 2018-11-23 ENCOUNTER — Other Ambulatory Visit: Payer: Self-pay

## 2018-11-23 ENCOUNTER — Inpatient Hospital Stay (HOSPITAL_COMMUNITY): Payer: Medicare Other

## 2018-11-23 ENCOUNTER — Encounter (HOSPITAL_COMMUNITY): Payer: Self-pay

## 2018-11-23 ENCOUNTER — Observation Stay (HOSPITAL_COMMUNITY)
Admission: RE | Admit: 2018-11-23 | Discharge: 2018-11-23 | Disposition: A | Discharge status: Home / Self Care | Payer: Medicare Other | Attending: Neurosurgery | Admitting: Neurosurgery

## 2018-11-23 DIAGNOSIS — Z79899 Other long term (current) drug therapy: Secondary | ICD-10-CM | POA: Insufficient documentation

## 2018-11-23 DIAGNOSIS — M1990 Unspecified osteoarthritis, unspecified site: Secondary | ICD-10-CM | POA: Insufficient documentation

## 2018-11-23 DIAGNOSIS — M50123 Cervical disc disorder at C6-C7 level with radiculopathy: Secondary | ICD-10-CM | POA: Insufficient documentation

## 2018-11-23 DIAGNOSIS — M50023 Cervical disc disorder at C6-C7 level with myelopathy: Secondary | ICD-10-CM | POA: Diagnosis not present

## 2018-11-23 DIAGNOSIS — M50122 Cervical disc disorder at C5-C6 level with radiculopathy: Secondary | ICD-10-CM | POA: Insufficient documentation

## 2018-11-23 DIAGNOSIS — Z86718 Personal history of other venous thrombosis and embolism: Secondary | ICD-10-CM | POA: Diagnosis not present

## 2018-11-23 DIAGNOSIS — F329 Major depressive disorder, single episode, unspecified: Secondary | ICD-10-CM | POA: Insufficient documentation

## 2018-11-23 DIAGNOSIS — I739 Peripheral vascular disease, unspecified: Secondary | ICD-10-CM | POA: Insufficient documentation

## 2018-11-23 DIAGNOSIS — Z419 Encounter for procedure for purposes other than remedying health state, unspecified: Secondary | ICD-10-CM

## 2018-11-23 DIAGNOSIS — J449 Chronic obstructive pulmonary disease, unspecified: Secondary | ICD-10-CM | POA: Insufficient documentation

## 2018-11-23 DIAGNOSIS — M4802 Spinal stenosis, cervical region: Secondary | ICD-10-CM | POA: Diagnosis not present

## 2018-11-23 DIAGNOSIS — M4722 Other spondylosis with radiculopathy, cervical region: Principal | ICD-10-CM | POA: Insufficient documentation

## 2018-11-23 DIAGNOSIS — M4712 Other spondylosis with myelopathy, cervical region: Secondary | ICD-10-CM | POA: Insufficient documentation

## 2018-11-23 DIAGNOSIS — Z87891 Personal history of nicotine dependence: Secondary | ICD-10-CM | POA: Diagnosis not present

## 2018-11-23 DIAGNOSIS — Z85828 Personal history of other malignant neoplasm of skin: Secondary | ICD-10-CM | POA: Diagnosis not present

## 2018-11-23 HISTORY — DX: Pneumonia, unspecified organism: J18.9

## 2018-11-23 HISTORY — PX: ANTERIOR CERVICAL DECOMP/DISCECTOMY FUSION: SHX1161

## 2018-11-23 HISTORY — DX: Depression, unspecified: F32.A

## 2018-11-23 HISTORY — DX: Malignant (primary) neoplasm, unspecified: C80.1

## 2018-11-23 HISTORY — DX: Unspecified osteoarthritis, unspecified site: M19.90

## 2018-11-23 HISTORY — DX: Peripheral vascular disease, unspecified: I73.9

## 2018-11-23 LAB — BASIC METABOLIC PANEL
Anion gap: 8 (ref 5–15)
BUN: 18 mg/dL (ref 8–23)
CO2: 23 mmol/L (ref 22–32)
Calcium: 8.8 mg/dL — ABNORMAL LOW (ref 8.9–10.3)
Chloride: 109 mmol/L (ref 98–111)
Creatinine, Ser: 1.13 mg/dL (ref 0.61–1.24)
GFR calc Af Amer: >60 mL/min (ref >60)
GFR calc non Af Amer: >60 mL/min (ref >60)
Glucose, Bld: 119 mg/dL — ABNORMAL HIGH (ref 70–99)
Potassium: 3.8 mmol/L (ref 3.5–5.1)
Sodium: 140 mmol/L (ref 135–145)

## 2018-11-23 LAB — CBC WITH DIFFERENTIAL/PLATELET
Abs Immature Granulocytes: 0.02 10*3/uL (ref 0.00–0.07)
Basophils Absolute: 0 10*3/uL (ref 0.0–0.1)
Basophils Relative: 1 %
Eosinophils Absolute: 0.1 10*3/uL (ref 0.0–0.5)
Eosinophils Relative: 2 %
HCT: 44.8 % (ref 39.0–52.0)
Hemoglobin: 15.2 g/dL (ref 13.0–17.0)
Immature Granulocytes: 0 %
Lymphocytes Relative: 42 %
Lymphs Abs: 2.4 10*3/uL (ref 0.7–4.0)
MCH: 32.1 pg (ref 26.0–34.0)
MCHC: 33.9 g/dL (ref 30.0–36.0)
MCV: 94.5 fL (ref 80.0–100.0)
Monocytes Absolute: 0.5 10*3/uL (ref 0.1–1.0)
Monocytes Relative: 10 %
Neutro Abs: 2.6 10*3/uL (ref 1.7–7.7)
Neutrophils Relative %: 45 %
Platelets: 198 10*3/uL (ref 150–400)
RBC: 4.74 MIL/uL (ref 4.22–5.81)
RDW: 12.7 % (ref 11.5–15.5)
WBC: 5.6 10*3/uL (ref 4.0–10.5)
nRBC: 0 % (ref 0.0–0.2)

## 2018-11-23 SURGERY — ANTERIOR CERVICAL DECOMPRESSION/DISCECTOMY FUSION 2 LEVELS
Anesthesia: General

## 2018-11-23 MED ORDER — FENTANYL CITRATE (PF) 250 MCG/5ML IJ SOLN
INTRAMUSCULAR | Status: AC
  Filled 2018-11-23: qty 5

## 2018-11-23 MED ORDER — ACETAMINOPHEN 650 MG RE SUPP: 650.0000 mg | RECTAL | Status: DC | PRN

## 2018-11-23 MED ORDER — ONDANSETRON HCL 4 MG/2ML IJ SOLN
INTRAMUSCULAR | Status: DC | PRN
  Administered 2018-11-23: 4 mg via INTRAVENOUS

## 2018-11-23 MED ORDER — PROPOFOL 10 MG/ML IV BOLUS
INTRAVENOUS | Status: AC
  Filled 2018-11-23: qty 40

## 2018-11-23 MED ORDER — CHLORHEXIDINE GLUCONATE CLOTH 2 % EX PADS: 6.0000 | MEDICATED_PAD | Freq: Once | CUTANEOUS | Status: DC

## 2018-11-23 MED ORDER — MENTHOL 3 MG MT LOZG
1.0000 | LOZENGE | OROMUCOSAL | Status: DC | PRN
  Filled 2018-11-23: qty 9

## 2018-11-23 MED ORDER — FENTANYL CITRATE (PF) 250 MCG/5ML IJ SOLN
INTRAMUSCULAR | Status: DC | PRN
  Administered 2018-11-23 (×2): 100 ug via INTRAVENOUS
  Administered 2018-11-23: 50 ug via INTRAVENOUS

## 2018-11-23 MED ORDER — MIDAZOLAM HCL 2 MG/2ML IJ SOLN
INTRAMUSCULAR | Status: AC
  Filled 2018-11-23: qty 2

## 2018-11-23 MED ORDER — THROMBIN 5000 UNITS EX SOLR
OROMUCOSAL | Status: DC | PRN
  Administered 2018-11-23: 09:00:00 via TOPICAL

## 2018-11-23 MED ORDER — MIDAZOLAM HCL 2 MG/2ML IJ SOLN
INTRAMUSCULAR | Status: DC | PRN
  Administered 2018-11-23: 2 mg via INTRAVENOUS

## 2018-11-23 MED ORDER — 0.9 % SODIUM CHLORIDE (POUR BTL) OPTIME
TOPICAL | Status: DC | PRN
  Administered 2018-11-23: 1000 mL

## 2018-11-23 MED ORDER — LACTATED RINGERS IV SOLN
INTRAVENOUS | Status: DC | PRN
  Administered 2018-11-23 (×2): via INTRAVENOUS

## 2018-11-23 MED ORDER — THROMBIN 5000 UNITS EX SOLR
CUTANEOUS | Status: AC
  Filled 2018-11-23: qty 5000

## 2018-11-23 MED ORDER — SODIUM CHLORIDE 0.9 % IV SOLN
INTRAVENOUS | Status: DC | PRN
  Administered 2018-11-23: 500 mL

## 2018-11-23 MED ORDER — ROCURONIUM BROMIDE 10 MG/ML (PF) SYRINGE
PREFILLED_SYRINGE | INTRAVENOUS | Status: DC | PRN
  Administered 2018-11-23: 50 mg via INTRAVENOUS

## 2018-11-23 MED ORDER — VITAMIN D 25 MCG (1000 UNIT) PO TABS: 10000.0000 [IU] | ORAL_TABLET | Freq: Every day | ORAL | Status: DC

## 2018-11-23 MED ORDER — THROMBIN 5000 UNITS EX SOLR
CUTANEOUS | Status: DC | PRN
  Administered 2018-11-23: 10000 [IU] via TOPICAL

## 2018-11-23 MED ORDER — ONDANSETRON HCL 4 MG/2ML IJ SOLN: 4.0000 mg | Freq: Four times a day (QID) | INTRAMUSCULAR | Status: DC | PRN

## 2018-11-23 MED ORDER — PHENOL 1.4 % MT LIQD
1.0000 | OROMUCOSAL | Status: DC | PRN
  Administered 2018-11-23: 1 via OROMUCOSAL
  Filled 2018-11-23: qty 177

## 2018-11-23 MED ORDER — PROPOFOL 10 MG/ML IV BOLUS
INTRAVENOUS | Status: DC | PRN
  Administered 2018-11-23: 160 mg via INTRAVENOUS

## 2018-11-23 MED ORDER — EPHEDRINE SULFATE-NACL 50-0.9 MG/10ML-% IV SOSY
PREFILLED_SYRINGE | INTRAVENOUS | Status: DC | PRN
  Administered 2018-11-23: 10 mg via INTRAVENOUS

## 2018-11-23 MED ORDER — ROCURONIUM BROMIDE 10 MG/ML (PF) SYRINGE
PREFILLED_SYRINGE | INTRAVENOUS | Status: AC
  Filled 2018-11-23: qty 10

## 2018-11-23 MED ORDER — ONDANSETRON HCL 4 MG/2ML IJ SOLN: 4.0000 mg | Freq: Once | INTRAMUSCULAR | Status: DC | PRN

## 2018-11-23 MED ORDER — SODIUM CHLORIDE 0.9% FLUSH: 3.0000 mL | INTRAVENOUS | Status: DC | PRN

## 2018-11-23 MED ORDER — LEVALBUTEROL TARTRATE 45 MCG/ACT IN AERO: 2.0000 | INHALATION_SPRAY | Freq: Four times a day (QID) | RESPIRATORY_TRACT | Status: DC | PRN

## 2018-11-23 MED ORDER — LIDOCAINE 2% (20 MG/ML) 5 ML SYRINGE
INTRAMUSCULAR | Status: AC
  Filled 2018-11-23: qty 5

## 2018-11-23 MED ORDER — PREDNISONE 10 MG PO TABS: 10.0000 mg | ORAL_TABLET | Freq: Every day | ORAL | Status: DC

## 2018-11-23 MED ORDER — MEPERIDINE HCL 25 MG/ML IJ SOLN: 6.2500 mg | INTRAMUSCULAR | Status: DC | PRN

## 2018-11-23 MED ORDER — THROMBIN 5000 UNITS EX SOLR
CUTANEOUS | Status: AC
  Filled 2018-11-23: qty 10000

## 2018-11-23 MED ORDER — CYCLOBENZAPRINE HCL 10 MG PO TABS
10.0000 mg | ORAL_TABLET | Freq: Three times a day (TID) | ORAL | Status: DC | PRN
  Administered 2018-11-23: 10 mg via ORAL

## 2018-11-23 MED ORDER — HYDROMORPHONE HCL 1 MG/ML IJ SOLN: 1.0000 mg | INTRAMUSCULAR | Status: DC | PRN

## 2018-11-23 MED ORDER — PHENYLEPHRINE 40 MCG/ML (10ML) SYRINGE FOR IV PUSH (FOR BLOOD PRESSURE SUPPORT)
PREFILLED_SYRINGE | INTRAVENOUS | Status: DC | PRN
  Administered 2018-11-23: 80 ug via INTRAVENOUS

## 2018-11-23 MED ORDER — CYCLOBENZAPRINE HCL 10 MG PO TABS
ORAL_TABLET | ORAL | Status: AC
  Filled 2018-11-23: qty 1

## 2018-11-23 MED ORDER — HEMOSTATIC AGENTS (NO CHARGE) OPTIME
TOPICAL | Status: DC | PRN
  Administered 2018-11-23: 1 via TOPICAL

## 2018-11-23 MED ORDER — HYDROCODONE-ACETAMINOPHEN 5-325 MG PO TABS: 1.0000 | ORAL_TABLET | ORAL | 0 refills | Status: DC | PRN

## 2018-11-23 MED ORDER — SODIUM CHLORIDE 0.9% FLUSH: 3.0000 mL | Freq: Two times a day (BID) | INTRAVENOUS | Status: DC

## 2018-11-23 MED ORDER — CEFAZOLIN SODIUM-DEXTROSE 2-4 GM/100ML-% IV SOLN
2.0000 g | INTRAVENOUS | Status: AC
  Administered 2018-11-23: 2 g via INTRAVENOUS
  Filled 2018-11-23: qty 100

## 2018-11-23 MED ORDER — CEFAZOLIN SODIUM-DEXTROSE 1-4 GM/50ML-% IV SOLN
1.0000 g | Freq: Three times a day (TID) | INTRAVENOUS | Status: DC
  Administered 2018-11-23: 1 g via INTRAVENOUS
  Filled 2018-11-23: qty 50

## 2018-11-23 MED ORDER — DEXAMETHASONE SODIUM PHOSPHATE 10 MG/ML IJ SOLN
INTRAMUSCULAR | Status: AC
  Filled 2018-11-23: qty 1

## 2018-11-23 MED ORDER — ADULT MULTIVITAMIN W/MINERALS CH: 1.0000 | ORAL_TABLET | Freq: Every day | ORAL | Status: DC

## 2018-11-23 MED ORDER — LEVALBUTEROL HCL 0.63 MG/3ML IN NEBU
0.6300 mg | INHALATION_SOLUTION | Freq: Four times a day (QID) | RESPIRATORY_TRACT | Status: DC | PRN
  Filled 2018-11-23: qty 3

## 2018-11-23 MED ORDER — SUGAMMADEX SODIUM 200 MG/2ML IV SOLN
INTRAVENOUS | Status: DC | PRN
  Administered 2018-11-23: 200 mg via INTRAVENOUS

## 2018-11-23 MED ORDER — ESCITALOPRAM OXALATE 10 MG PO TABS: 10.0000 mg | ORAL_TABLET | Freq: Every day | ORAL | Status: DC

## 2018-11-23 MED ORDER — SODIUM CHLORIDE 0.9 % IV SOLN: 250.0000 mL | INTRAVENOUS | Status: DC

## 2018-11-23 MED ORDER — DEXAMETHASONE SODIUM PHOSPHATE 10 MG/ML IJ SOLN
10.0000 mg | Freq: Once | INTRAMUSCULAR | Status: DC
  Filled 2018-11-23: qty 1

## 2018-11-23 MED ORDER — PHENYLEPHRINE 40 MCG/ML (10ML) SYRINGE FOR IV PUSH (FOR BLOOD PRESSURE SUPPORT)
PREFILLED_SYRINGE | INTRAVENOUS | Status: AC
  Filled 2018-11-23: qty 10

## 2018-11-23 MED ORDER — HYDROMORPHONE HCL 1 MG/ML IJ SOLN
0.2500 mg | INTRAMUSCULAR | Status: DC | PRN
  Administered 2018-11-23 (×2): 0.25 mg via INTRAVENOUS

## 2018-11-23 MED ORDER — EPHEDRINE 5 MG/ML INJ
INTRAVENOUS | Status: AC
  Filled 2018-11-23: qty 10

## 2018-11-23 MED ORDER — LIDOCAINE 2% (20 MG/ML) 5 ML SYRINGE
INTRAMUSCULAR | Status: DC | PRN
  Administered 2018-11-23: 100 mg via INTRAVENOUS

## 2018-11-23 MED ORDER — ACETAMINOPHEN 325 MG PO TABS: 650.0000 mg | ORAL_TABLET | ORAL | Status: DC | PRN

## 2018-11-23 MED ORDER — CYCLOBENZAPRINE HCL 10 MG PO TABS: 10.0000 mg | ORAL_TABLET | Freq: Three times a day (TID) | ORAL | 0 refills | Status: DC | PRN

## 2018-11-23 MED ORDER — ONDANSETRON HCL 4 MG PO TABS: 4.0000 mg | ORAL_TABLET | Freq: Four times a day (QID) | ORAL | Status: DC | PRN

## 2018-11-23 MED ORDER — ONDANSETRON HCL 4 MG/2ML IJ SOLN
INTRAMUSCULAR | Status: AC
  Filled 2018-11-23: qty 2

## 2018-11-23 MED ORDER — HYDROCODONE-ACETAMINOPHEN 5-325 MG PO TABS: 1.0000 | ORAL_TABLET | ORAL | Status: DC | PRN

## 2018-11-23 MED ORDER — HYDROMORPHONE HCL 1 MG/ML IJ SOLN
INTRAMUSCULAR | Status: AC
  Filled 2018-11-23: qty 1

## 2018-11-23 MED ORDER — DEXAMETHASONE SODIUM PHOSPHATE 10 MG/ML IJ SOLN
INTRAMUSCULAR | Status: DC | PRN
  Administered 2018-11-23: 10 mg via INTRAVENOUS

## 2018-11-23 MED ORDER — HYDROCODONE-ACETAMINOPHEN 10-325 MG PO TABS
2.0000 | ORAL_TABLET | ORAL | Status: DC | PRN
  Administered 2018-11-23: 2 via ORAL
  Filled 2018-11-23: qty 2

## 2018-11-23 SURGICAL SUPPLY — 57 items
APL SKNCLS STERI-STRIP NONHPOA (GAUZE/BANDAGES/DRESSINGS) ×1
BAG DECANTER FOR FLEXI CONT (MISCELLANEOUS) ×2 IMPLANT
BENZOIN TINCTURE PRP APPL 2/3 (GAUZE/BANDAGES/DRESSINGS) ×2 IMPLANT
BIT DRILL 13 (BIT) ×1 IMPLANT
BUR MATCHSTICK NEURO 3.0 LAGG (BURR) ×2 IMPLANT
CAGE PEEK 6X14X11 (Cage) ×2 IMPLANT
CAGE PEEK 8X14X11 (Cage) ×1 IMPLANT
CANISTER SUCT 3000ML PPV (MISCELLANEOUS) ×2 IMPLANT
CARTRIDGE OIL MAESTRO DRILL (MISCELLANEOUS) ×1 IMPLANT
COVER WAND RF STERILE (DRAPES) ×1 IMPLANT
DIFFUSER DRILL AIR PNEUMATIC (MISCELLANEOUS) ×2 IMPLANT
DRAPE C-ARM 42X72 X-RAY (DRAPES) ×4 IMPLANT
DRAPE LAPAROTOMY 100X72 PEDS (DRAPES) ×2 IMPLANT
DRAPE MICROSCOPE LEICA (MISCELLANEOUS) ×2 IMPLANT
DURAPREP 6ML APPLICATOR 50/CS (WOUND CARE) ×2 IMPLANT
ELECT COATED BLADE 2.86 ST (ELECTRODE) ×2 IMPLANT
ELECT REM PT RETURN 9FT ADLT (ELECTROSURGICAL) ×2
ELECTRODE REM PT RTRN 9FT ADLT (ELECTROSURGICAL) ×1 IMPLANT
GAUZE 4X4 16PLY RFD (DISPOSABLE) IMPLANT
GAUZE SPONGE 4X4 12PLY STRL (GAUZE/BANDAGES/DRESSINGS) ×2 IMPLANT
GAUZE SPONGE 4X4 16PLY XRAY LF (GAUZE/BANDAGES/DRESSINGS) ×1 IMPLANT
GLOVE ECLIPSE 9.0 STRL (GLOVE) ×2 IMPLANT
GLOVE EXAM NITRILE XL STR (GLOVE) IMPLANT
GOWN STRL REUS W/ TWL LRG LVL3 (GOWN DISPOSABLE) IMPLANT
GOWN STRL REUS W/ TWL XL LVL3 (GOWN DISPOSABLE) ×1 IMPLANT
GOWN STRL REUS W/TWL 2XL LVL3 (GOWN DISPOSABLE) IMPLANT
GOWN STRL REUS W/TWL LRG LVL3 (GOWN DISPOSABLE)
GOWN STRL REUS W/TWL XL LVL3 (GOWN DISPOSABLE) ×2
HALTER HD/CHIN CERV TRACTION D (MISCELLANEOUS) ×2 IMPLANT
HEMOSTAT POWDER KIT SURGIFOAM (HEMOSTASIS) ×1 IMPLANT
HEMOSTAT SURGICEL 2X14 (HEMOSTASIS) IMPLANT
KIT BASIN OR (CUSTOM PROCEDURE TRAY) ×2 IMPLANT
KIT TURNOVER KIT B (KITS) ×2 IMPLANT
NDL SPNL 20GX3.5 QUINCKE YW (NEEDLE) ×1 IMPLANT
NEEDLE SPNL 20GX3.5 QUINCKE YW (NEEDLE) ×2 IMPLANT
NS IRRIG 1000ML POUR BTL (IV SOLUTION) ×2 IMPLANT
OIL CARTRIDGE MAESTRO DRILL (MISCELLANEOUS) ×2
PACK LAMINECTOMY NEURO (CUSTOM PROCEDURE TRAY) ×2 IMPLANT
PAD ARMBOARD 7.5X6 YLW CONV (MISCELLANEOUS) ×6 IMPLANT
PLATE 45MM (Plate) ×2 IMPLANT
PLATE 45XATL VS ELT (Plate) IMPLANT
RUBBERBAND STERILE (MISCELLANEOUS) ×4 IMPLANT
SCREW ST 13X4XST VA NS SPNE (Screw) IMPLANT
SCREW ST VAR 4 ATL (Screw) ×12 IMPLANT
SPACER SPNL 11X14X6XPEEK CVD (Cage) IMPLANT
SPCR SPNL 11X14X6XPEEK CVD (Cage) ×1 IMPLANT
SPONGE INTESTINAL PEANUT (DISPOSABLE) ×2 IMPLANT
SPONGE SURGIFOAM ABS GEL SZ50 (HEMOSTASIS) ×2 IMPLANT
STRIP CLOSURE SKIN 1/2X4 (GAUZE/BANDAGES/DRESSINGS) ×2 IMPLANT
SUT VIC AB 3-0 SH 8-18 (SUTURE) ×2 IMPLANT
SUT VIC AB 4-0 RB1 18 (SUTURE) ×2 IMPLANT
TAPE CLOTH 4X10 WHT NS (GAUZE/BANDAGES/DRESSINGS) ×2 IMPLANT
TAPE CLOTH SURG 4X10 WHT LF (GAUZE/BANDAGES/DRESSINGS) ×1 IMPLANT
TOWEL GREEN STERILE (TOWEL DISPOSABLE) ×2 IMPLANT
TOWEL GREEN STERILE FF (TOWEL DISPOSABLE) ×2 IMPLANT
TRAP SPECIMEN MUCOUS 40CC (MISCELLANEOUS) ×2 IMPLANT
WATER STERILE IRR 1000ML POUR (IV SOLUTION) ×2 IMPLANT

## 2018-11-23 NOTE — Transfer of Care (Signed)
Immediate Anesthesia Transfer of Care Note  Patient: James Harris  Procedure(s) Performed: Anterior Cervical Discectomy Fusion - Cervical Five-Cervical Six - Cervical Six-Cervical Seven (N/A )  Patient Location: PACU  Anesthesia Type:General  Level of Consciousness: drowsy and patient cooperative  Airway & Oxygen Therapy: Patient Spontanous Breathing and Patient connected to face mask oxygen  Post-op Assessment: Report given to RN and Post -op Vital signs reviewed and stable  Post vital signs: Reviewed and stable  Last Vitals:  Vitals Value Taken Time  BP 155/87 11/23/18 1032  Temp    Pulse 74 11/23/18 1031  Resp 15 11/23/18 1031  SpO2 99 % 11/23/18 1031  Vitals shown include unvalidated device data.  Last Pain:  Vitals:   11/23/18 0631  TempSrc:   PainSc: 4          Complications: No apparent anesthesia complications

## 2018-11-23 NOTE — Discharge Summary (Signed)
Physician Discharge Summary  Patient ID: James Harris MRN: [id number] DOB/AGE: 1955-05-28 63 y.o.  Admit date: 11/23/2018 Discharge date: 11/23/2018  Admission Diagnoses:  Discharge Diagnoses:  Active Problems:   Cervical spondylosis with myelopathy and radiculopathy   Discharged Condition: good  Hospital Course: Patient admitted to the hospital where he underwent uncomplicated two-level lumbar decompression and fusion.  Postoperatively doing well.  Preoperative neck and upper extremity pain much improved.  Standing walking and voiding without difficulty.  Ready for discharge home.  Consults:   Significant Diagnostic Studies:   Treatments:   Discharge Exam: Blood pressure (!) 164/89, pulse 69, temperature 98.1 F (36.7 C), temperature source Oral, resp. rate 18, height 6' (1.829 m), weight 105.2 kg, SpO2 96 %. Awake and alert.  Oriented and appropriate.  Cranial nerve function intact.  Motor and sensory function intact.  Wound clean and dry.  Neck soft.  Chest and abdomen benign.  Disposition: Discharge disposition: 01-Home or Self Care        Allergies as of 11/23/2018   No Known Allergies     Medication List    TAKE these medications   cyclobenzaprine 10 MG tablet Commonly known as: FLEXERIL Take 1 tablet (10 mg total) by mouth 3 (three) times daily as needed for muscle spasms.   Dialyvite Vitamin D 5000 125 MCG (5000 UT) capsule Generic drug: Cholecalciferol Take 10,000 Units by mouth daily.   escitalopram 10 MG tablet Commonly known as: LEXAPRO Take 10 mg by mouth daily.   HYDROcodone-acetaminophen 5-325 MG tablet Commonly known as: NORCO/VICODIN Take 1 tablet by mouth every 4 (four) hours as needed for moderate pain ((score 4 to 6)).   levalbuterol 45 MCG/ACT inhaler Commonly known as: Xopenex HFA Inhale 1 puff into the lungs 3 (three) times daily for 4 days. What changed:   how much to take  when to take this  reasons to take this    meloxicam 7.5 MG tablet Commonly known as: Mobic Take 1 tablet (7.5 mg total) by mouth daily.   multivitamin with minerals Tabs tablet Take 1 tablet by mouth daily.   predniSONE 10 MG tablet Commonly known as: DELTASONE Take 10 mg by mouth daily.        SignedCooper Render Cristin Szatkowski 11/23/2018, 2:07 PM

## 2018-11-23 NOTE — Op Note (Signed)
Date of procedure: 11/23/2018  Date of dictation: Same  Service: Neurosurgery  Preoperative diagnosis: C5-6 spondylosis with stenosis, left C6-7 herniated nucleus pulposus with radiculopathy  Postoperative diagnosis: Same  Procedure Name: C5-6, C6-7 anterior cervical discectomy with interbody fusion utilizing interbody peek cages, locally harvested autograft, and anterior plate instrumentation  Surgeon:Tyson Parkison A.Marnisha Stampley, M.D.  Asst. Surgeon: Reinaldo Meeker, NP  Anesthesia: General  Indication: Patient is 63 year old male with severe neck and left upper extremity pain paresthesias and weakness consistent with a left-sided C7 radiculopathy which is failed conservative management.  Work-up demonstrates evidence of a moderately large left-sided C6-7 disc herniation.  Patient also with severe spondylosis and severe stenosis with cord and right-sided C6 nerve root compression at the C5-6 level.  Patient presents now for two-level anterior cervical decompression and fusion in hopes of improving his symptoms.  Operative note: After induction anesthesia, patient position supine with his neck slightly extended and held in place with halter traction.  Patient's anterior cervical region prepped and draped sterilely.  Incision made overlying C6.  Dissection performed on the right.  Retractor placed.  Fluoroscopy used.  Levels confirmed.  Disc spaces at C5-6 and C6-7 were incised.  Discectomy was then performed using various instruments down 12 the posterior annulus.  Microscope was then brought to the field used throughout the remainder of the discectomy.  Remaining aspects annulus and osteophytes removed using high-speed drill down 12 the posterior logical limb.  Posterior logical was elevated and resected in piecemeal fashion.  Underlying thecal sac was identified.  A wide central decompression was then performed undercutting the bodies of C5 and C6.  Decompression then proceeded into each neural foramina.  Wide  anterior foraminotomies were performed along the course exiting C6 nerve roots bilaterally.  Gelfoam was placed topically for hemostasis.  Attention then placed to the C6-7 level.  Discectomy was then performed at this level in a similar fashion with the findings of a large left-sided foraminal disc herniation which was resected.  Wound is then irrigated fanlike solution.  Gelfoam was placed topically for the hemostasis then removed.  [employer] anatomic peek cages packed with locally harvested autograft was then impacted in place at both levels.  Each cage was recessed slightly from the anterior cortical margin.  A 45 mm Atlantis anterior cervical plate was then placed over the C5, C6 and C7 levels.  This then attached under fluoroscopic guidance using 13 mm variable angle screws to each at all 3 levels.  All screws given a final tightening and found to be solidly within bone.  Locking screws engaged in all 3 levels.  Final images reveal good position of the cages and the hardware at the proper upper level with normal alignment of spine.  Wound is then irrigated one final time.  Hemostasis was assured with the bipolar electrocautery.  Wounds and closed in layers with Vicryl sutures.  Steri-Strips and sterile dressing were applied.  No apparent complications.  Patient tolerated the procedure well and he returns to the recovery room postop.

## 2018-11-23 NOTE — Plan of Care (Addendum)
Patient alert and oriented, mae's well, voiding adequate amount of urine, swallowing without difficulty, no c/o pain at time of discharge. Patient discharged home with family. Script and discharged instructions given to patient. Patient and family stated understanding of instructions given. Patient has an appointment with Dr. Pool  

## 2018-11-23 NOTE — Care Management CC44 (Signed)
Condition Code 44 Documentation Completed  Patient Details  Name: HOBBES NAULT MRN: [id number] Date of Birth: [birth year]   Condition Code 44 given:   yes Patient signature on Condition Code 44 notice:   no Documentation of 2 MD's agreement:   yes Code 44 added to claim:   yes    Sharin Mons, RN 11/23/2018, 5:07 PM

## 2018-11-23 NOTE — Progress Notes (Signed)
Orthopedic Tech Progress Note Patient Details:  James Harris 1955-02-08 [id number]  Ortho Devices Type of Ortho Device: Soft collar Ortho Device/Splint Interventions: Application   Post Interventions Patient Tolerated: Well Instructions Provided: Care of device   [place] Pink 11/23/2018, 11:13 AM

## 2018-11-23 NOTE — Anesthesia Procedure Notes (Signed)
Procedure Name: Intubation Date/Time: 11/23/2018 8:40 AM Performed by: Kathryne Hitch, CRNA Pre-anesthesia Checklist: Patient identified, Emergency Drugs available, Suction available and Patient being monitored Patient Re-evaluated:Patient Re-evaluated prior to induction Oxygen Delivery Method: Circle system utilized Preoxygenation: Pre-oxygenation with 100% oxygen Induction Type: IV induction Ventilation: Mask ventilation with difficulty, Oral airway inserted - appropriate to patient size and Two handed mask ventilation required Laryngoscope Size: Glidescope and 4 Grade View: Grade I Tube type: Oral Tube size: 7.5 mm Number of attempts: 1 Airway Equipment and Method: Stylet and Oral airway Placement Confirmation: ETT inserted through vocal cords under direct vision,  positive ETCO2 and breath sounds checked- equal and bilateral Secured at: 23 cm Tube secured with: Tape Dental Injury: Teeth and Oropharynx as per pre-operative assessment  Difficulty Due To: Difficulty was anticipated, Difficult Airway- due to reduced neck mobility and Difficult Airway- due to anterior larynx Comments: DVL x1 Miller 3 + Grade 3 view.

## 2018-11-23 NOTE — Care Management Obs Status (Signed)
[facility] NOTIFICATION   Patient Details  Name: CALVYN PORTERA MRN: [id number] Date of Birth: 02-Oct-1955   Medicare Observation Status Notification Given:  Yes    Sharin Mons, RN 11/23/2018, 5:09 PM

## 2018-11-23 NOTE — Discharge Instructions (Signed)
Wound Care Keep incision covered and dry for two days.  If you shower, cover incision with plastic wrap.  Do not put any creams, lotions, or ointments on incision. Leave steri-strips on back.  They will fall off by themselves. Activity Walk each and every day, increasing distance each day. No lifting greater than 5 lbs.  Avoid excessive neck motion. No driving for 2 weeks; may ride as a passenger locally. If provided with back brace, wear when out of bed.  It is not necessary to wear brace in bed. Diet Resume your normal diet.  Return to Work Will be discussed at you follow up appointment. Call Your Doctor If Any of These Occur Redness, drainage, or swelling at the wound.  Temperature greater than 101 degrees. Severe pain not relieved by pain medication. Incision starts to come apart. Follow Up Appt Call today for appointment in 1-2 weeks [id number]) or for problems.  If you have any hardware placed in your spine, you will need an x-ray before your appointment.    Anterior Cervical Diskectomy and Fusion, Care After This sheet gives you information about how to care for yourself after your procedure. Your health care provider may also give you more specific instructions. If you have problems or questions, contact your health care provider. What can I expect after the procedure? After the procedure, it is common to have:  Neck pain.  Discomfort when swallowing.  Slight hoarseness. Follow these instructions at home: If you have a neck brace:  Wear it as told by your health care provider. Remove it only as told by your health care provider.  Keep the brace clean and dry.  Ask your health care provider if you should remove the brace to bathe or shower. Incision care   Follow instructions from your health care provider about how to take care of your incision. Make sure you: ? Wash your hands with soap and water before and after you change your bandage (dressing). If soap and  water are not available, use hand sanitizer. ? Change your dressing as told by your health care provider. ? Leave stitches (sutures), skin glue, or adhesive strips in place. These skin closures may need to stay in place for 2 weeks or longer. If adhesive strip edges start to loosen and curl up, you may trim the loose edges. Do not remove adhesive strips completely unless your health care provider tells you to do that.  Check your incision area every day for signs of infection. Check for: ? Redness, swelling, or pain. ? Fluid or blood. ? Warmth. ? Pus or a bad smell. Managing pain, stiffness, and swelling   Take over-the-counter and prescription medicines only as told by your health care provider.  If directed, put ice on the injured area. ? If you have a removable brace, remove it as told by your health care provider. ? Put ice in a plastic bag. ? Place a towel between your skin and the bag. ? Leave the ice on for 20 minutes, 2-3 times a day. Activity   Return to your normal activities as told by your health care provider. Ask your health care provider what activities are safe for you.  Do exercises as told by your health care provider.  Do not take baths, swim, or use a hot tub until your health care provider approves.  Do not lift anything that is heavier than 10 lb (4.5 kg), or the limit that you are told, until your health care provider says  that it is safe. General instructions  Ask your health care provider if the medicine prescribed to you: ? Requires you to avoid driving or using heavy machinery. ? Can cause constipation. You may need to take actions to prevent or treat constipation, such as:  Drink enough fluid to keep your urine pale yellow.  Take over-the-counter or prescription medicines.  Eat foods that are high in fiber, such as beans, whole grains, and fresh fruits and vegetables.  Limit foods that are high in fat and processed sugars, such as fried and sweet  foods.  Do not use any products that contain nicotine or tobacco, such as cigarettes, e-cigarettes, and chewing tobacco. These can delay healing. If you need help quitting, ask your health care provider.  Keep all follow-up visits and physical therapy appointments as told by your health care provider. This is important. Contact a health care provider if you have:  A fever.  Redness, swelling, or pain around your incision.  Fluid or blood coming from your incision.  Pus or a bad smell coming from your incision.  Pain that is not controlled by your pain medicine.  Increasing hoarseness or trouble swallowing. Get help right away if you have:  Severe pain.  Sudden numbness or weakness in your arms.  Warmth, tenderness, or swelling in your calf.  Chest pain.  Difficulty breathing. Summary  After the procedure, it is common to have neck pain, discomfort when swallowing, and slight hoarseness.  Follow instructions from your health care provider about how to take care of your incision.  Check your incision area every day for signs of infection.  Return to your normal activities as told by your health care provider. Ask your health care provider what activities are safe for you.  Contact a health care provider if you have signs of infection at your incision. This information is not intended to replace advice given to you by your health care provider. Make sure you discuss any questions you have with your health care provider. Document Released: 01/26/2015 Document Revised: 09/24/2017 Document Reviewed: 09/24/2017 Elsevier Patient Education  2020 [employer].

## 2018-11-23 NOTE — Care Management CC44 (Signed)
Condition Code 44 Documentation Completed  Patient Details  Name: CHUONG VONBANK MRN: [id number] Date of Birth: 09-01-1955   Condition Code 44 given:  Yes Patient signature on Condition Code 44 notice:  Yes Documentation of 2 MD's agreement:  Yes Code 44 added to claim:  Yes    Sharin Mons, RN 11/23/2018, 5:09 PM

## 2018-11-23 NOTE — Evaluation (Signed)
Occupational Therapy Evaluation Patient Details Name: James Harris MRN: [id number] DOB: 24-Oct-1955 Today's Date: 11/23/2018    History of Present Illness 63 yo male s/p ACDF C5-6 C6-7 PMH  Past Medical History:  Diagnosis Date  . Arthritis   . Cancer ([place])    basal cell on nose  . COPD (chronic obstructive pulmonary disease) ([place])   . Depression    in the past  . Peripheral vascular disease ([place])    blood clot in left leg  . Pneumonia       Clinical Impression   Patient evaluated by Occupational Therapy with no further acute OT needs identified. All education has been completed and the patient has no further questions. See below for any follow-up Occupational Therapy or equipment needs. OT to sign off. Thank you for referral.      Follow Up Recommendations  No OT follow up    Equipment Recommendations  None recommended by OT    Recommendations for Other Services       Precautions / Restrictions Precautions Precautions: Cervical Precaution Comments: reviewed Cervical precautions Required Braces or Orthoses: Cervical Brace Cervical Brace: Soft collar      Mobility Bed Mobility Overal bed mobility: Independent                Transfers Overall transfer level: Independent                    Balance                                           ADL either performed or assessed with clinical judgement   ADL Overall ADL's : Independent     Cervical precautions ( handout provided): Educated patient on don doff brace with return demonstration, educated on oral care using cups, washing face with cloth, never to wash directly on incision site, avoid neck rotation flexion and extension, positioning with pillows in chair for bil UE, sleeping positioning, avoiding pushing / pulling with bil UE, Pt educated on need to notify doctor / RN of swallowing changes or choking..                                     General ADL  Comments: able to figure 4 cross      Vision Baseline Vision/History: Wears glasses Wears Glasses: At all times       Perception     Praxis      Pertinent Vitals/Pain Pain Assessment: No/denies pain     Hand Dominance Right   Extremity/Trunk Assessment Upper Extremity Assessment Upper Extremity Assessment: Overall WFL for tasks assessed(reports no deficits no sensation changes)   Lower Extremity Assessment Lower Extremity Assessment: Overall WFL for tasks assessed   Cervical / Trunk Assessment Cervical / Trunk Assessment: Other exceptions Cervical / Trunk Exceptions: sp surg   Communication Communication Communication: No difficulties   Cognition Arousal/Alertness: Awake/alert Behavior During Therapy: WFL for tasks assessed/performed Overall Cognitive Status: Within Functional Limits for tasks assessed                                     General Comments       Exercises     Shoulder Instructions  Home Living Family/patient expects to be discharged to:: Private residence Living Arrangements: Spouse/significant other Available Help at Discharge: Family Type of Home: House Home Access: Stairs to enter Technical brewer of Steps: 1   [place]: One level     Bathroom Shower/Tub: Teacher, early years/pre: Handicapped height     [place]: Shower seat          Prior Functioning/Environment Level of Independence: Independent        Comments: have multiple cats in the home        OT Problem List:        OT Treatment/Interventions:      OT Goals(Current goals can be found in the care plan section) Acute Rehab OT Goals Patient Stated Goal: to go to the [employer] retreat for the youth  OT Frequency:     Barriers to D/C:            Co-evaluation              AM-PAC OT "6 Clicks" Daily Activity     Outcome Measure Help from another person eating meals?: None Help from another person taking  care of personal grooming?: None Help from another person toileting, which includes using toliet, bedpan, or urinal?: None Help from another person bathing (including washing, rinsing, drying)?: None Help from another person to put on and taking off regular upper body clothing?: None Help from another person to put on and taking off regular lower body clothing?: None 6 Click Score: 24   End of Session Equipment Utilized During Treatment: Cervical collar Nurse Communication: Mobility status;Precautions  Activity Tolerance: Patient tolerated treatment well Patient left: in bed;with call bell/phone within reach;with family/visitor present  OT Visit Diagnosis: Muscle weakness (generalized) (M62.81)                Time: [id number] OT Time Calculation (min): 24 min Charges:  OT General Charges $OT Visit: 1 Visit OT Evaluation $OT Eval Moderate Complexity: 1 Mod   Brynn, OTR/L  Acute Rehabilitation Services Pager: [phone] Office: [phone] .   Jeri Modena 11/23/2018, 5:03 PM

## 2018-11-23 NOTE — Anesthesia Postprocedure Evaluation (Signed)
Anesthesia Post Note  Patient: James Harris  Procedure(s) Performed: Anterior Cervical Discectomy Fusion - Cervical Five-Cervical Six - Cervical Six-Cervical Seven (N/A )     Patient location during evaluation: PACU Anesthesia Type: General Level of consciousness: awake Pain management: pain level controlled Vital Signs Assessment: post-procedure vital signs reviewed and stable Respiratory status: spontaneous breathing Cardiovascular status: stable Postop Assessment: no apparent nausea or vomiting Anesthetic complications: no    Last Vitals:  Vitals:   11/23/18 1144 11/23/18 1201  BP: (!) 154/78 (!) 164/89  Pulse: 82 69  Resp: 17 18  Temp: (!) 36.3 C 36.7 C  SpO2: 96%     Last Pain:  Vitals:   11/23/18 1201  TempSrc: Oral  PainSc:                  Latrena Benegas

## 2018-11-23 NOTE — Brief Op Note (Signed)
11/23/2018  10:21 AM  PATIENT:  James Harris  63 y.o. male  PRE-OPERATIVE DIAGNOSIS:  Stenosis  POST-OPERATIVE DIAGNOSIS:  Stenosis  PROCEDURE:  Procedure(s) with comments: Anterior Cervical Discectomy Fusion - Cervical Five-Cervical Six - Cervical Six-Cervical Seven (N/A) - Anterior Cervical Discectomy Fusion - Cervical Five-Cervical Six - Cervical Six-Cervical Seven  SURGEON:  Surgeon(s) and Role:    Earnie Larsson, MD - Primary  PHYSICIAN ASSISTANT:  Bergman,NP ASSISTANTS:    ANESTHESIA:   general  EBL:  200 mL   BLOOD ADMINISTERED:none  DRAINS: none   LOCAL MEDICATIONS USED:  MARCAINE     SPECIMEN:  No Specimen  DISPOSITION OF SPECIMEN:  N/A  COUNTS:  YES  TOURNIQUET:  * No tourniquets in log *  DICTATION: .Dragon Dictation  PLAN OF CARE: Admit to inpatient   PATIENT DISPOSITION:  PACU - hemodynamically stable.   Delay start of Pharmacological VTE agent (>24hrs) due to surgical blood loss or risk of bleeding: yes

## 2018-11-23 NOTE — H&P (Signed)
James Harris is an 63 y.o. male.   Chief Complaint: Left arm pain HPI: 63 year old male with neck and left upper extremity pain paresthesias and weakness consistent with a left-sided C7 radiculopathy which is failed conservative management work-up demonstrates evidence for large left-sided C6-7 disc herniation extending into the left C7 foramen causing marked compression of the left C7 nerve root.  Patient also with severe spondylosis and a large spondylitic protrusion on the right side at C5-6 causing significant spinal stenosis and exiting right C6 nerve root compression.  Patient presents now for two-level anterior cervical decompression and fusion in hopes of improving his symptoms.  Past Medical History:  Diagnosis Date  . Arthritis   . Cancer ([place])    basal cell on nose  . COPD (chronic obstructive pulmonary disease) ([place])   . Depression    in the past  . Peripheral vascular disease ([place])    blood clot in left leg  . Pneumonia     Past Surgical History:  Procedure Laterality Date  . KNEE SURGERY  bilateral  . TONSILLECTOMY     x 2    Family History  Family history unknown: Yes   Social History:  reports that he has quit smoking. He has never used smokeless tobacco. He reports current alcohol use. He reports that he does not use drugs.  Allergies: No Known Allergies  Medications Prior to Admission  Medication Sig Dispense Refill  . Cholecalciferol (DIALYVITE VITAMIN D 5000) 125 MCG (5000 UT) capsule Take 10,000 Units by mouth daily.    Marland Kitchen escitalopram (LEXAPRO) 10 MG tablet Take 10 mg by mouth daily.  12  . levalbuterol (XOPENEX HFA) 45 MCG/ACT inhaler Inhale 1 puff into the lungs 3 (three) times daily for 4 days. (Patient taking differently: Inhale 2 puffs into the lungs every 6 (six) hours as needed for wheezing or shortness of breath. ) 1 Inhaler 0  . Multiple Vitamin (MULTIVITAMIN WITH MINERALS) TABS tablet Take 1 tablet by mouth daily.    . predniSONE (DELTASONE)  10 MG tablet Take 10 mg by mouth daily.     . meloxicam (MOBIC) 7.5 MG tablet Take 1 tablet (7.5 mg total) by mouth daily. (Patient not taking: Reported on 11/18/2018) 30 tablet 5    Results for orders placed or performed during the hospital encounter of 11/23/18 (from the past 48 hour(s))  Basic metabolic panel     Status: Abnormal   Collection Time: 11/23/18  6:39 AM  Result Value Ref Range   Sodium 140 135 - 145 mmol/L   Potassium 3.8 3.5 - 5.1 mmol/L   Chloride 109 98 - 111 mmol/L   CO2 23 22 - 32 mmol/L   Glucose, Bld 119 (H) 70 - 99 mg/dL   BUN 18 8 - 23 mg/dL   Creatinine, Ser 1.13 0.61 - 1.24 mg/dL   Calcium 8.8 (L) 8.9 - 10.3 mg/dL   GFR calc non Af Amer >60 >60 mL/min   GFR calc Af Amer >60 >60 mL/min   Anion gap 8 5 - 15    Comment: Performed at [facility], [place] [address], [place], [place] [address]  CBC WITH DIFFERENTIAL     Status: None   Collection Time: 11/23/18  6:39 AM  Result Value Ref Range   WBC 5.6 4.0 - 10.5 K/uL   RBC 4.74 4.22 - 5.81 MIL/uL   Hemoglobin 15.2 13.0 - 17.0 g/dL   HCT 44.8 39.0 - 52.0 %   MCV 94.5  80.0 - 100.0 fL   MCH 32.1 26.0 - 34.0 pg   MCHC 33.9 30.0 - 36.0 g/dL   RDW 12.7 11.5 - 15.5 %   Platelets 198 150 - 400 K/uL   nRBC 0.0 0.0 - 0.2 %   Neutrophils Relative % 45 %   Neutro Abs 2.6 1.7 - 7.7 K/uL   Lymphocytes Relative 42 %   Lymphs Abs 2.4 0.7 - 4.0 K/uL   Monocytes Relative 10 %   Monocytes Absolute 0.5 0.1 - 1.0 K/uL   Eosinophils Relative 2 %   Eosinophils Absolute 0.1 0.0 - 0.5 K/uL   Basophils Relative 1 %   Basophils Absolute 0.0 0.0 - 0.1 K/uL   Immature Granulocytes 0 %   Abs Immature Granulocytes 0.02 0.00 - 0.07 K/uL    Comment: Performed at [facility] [address], [place], [place] [address]   No results found.  Pertinent items noted in HPI and remainder of comprehensive ROS otherwise negative.  Blood pressure (!) 150/72, pulse 61, temperature 98.2 F (36.8 C), temperature source Oral,  resp. rate 18, height 6' (1.829 m), weight 105.2 kg, SpO2 97 %.  Patient is awake and alert.  He is oriented and appropriate.  Speech is fluent.  Judgment insight are intact.  Cranial nerve function normal bilateral.  Motor examination reveals weakness of his left triceps muscle group grading at 4-/5.  His weakness of his left grip.  Has weakness in his left finger extensors.  Patient with decreased sensation pinprick and light touch in his left C7 dermatome and right C6 dermatome.  Deep tendon reflexes are normal active.  No evidence of long track signs.  Gait and posture normal.  Examination head ears eyes nose throat summer.  Chest and abdomen are benign.  Extremities are free from injury or deformity. Assessment/Plan C56 spondylosis with stenosis, left C6-7 herniated nucleus pulposus with radiculopathy.  Plan C5-6, C6-7 anterior cervical discectomy with interbody fusion utilizing interbody cage, locally harvested autograft, and anterior plate instrumentation.  Risks and benefits of been explained.  Patient wishes to proceed.  Mallie Mussel A James Harris 11/23/2018, 7:45 AM

## 2018-11-25 ENCOUNTER — Encounter (HOSPITAL_COMMUNITY): Payer: Self-pay | Admitting: Neurosurgery

## 2018-12-21 ENCOUNTER — Encounter (INDEPENDENT_AMBULATORY_CARE_PROVIDER_SITE_OTHER): Payer: Self-pay | Admitting: Internal Medicine

## 2018-12-21 ENCOUNTER — Telehealth (INDEPENDENT_AMBULATORY_CARE_PROVIDER_SITE_OTHER): Payer: PPO | Admitting: Internal Medicine

## 2018-12-21 DIAGNOSIS — J441 Chronic obstructive pulmonary disease with (acute) exacerbation: Secondary | ICD-10-CM

## 2018-12-21 NOTE — Progress Notes (Signed)
Metrics: Intervention Frequency ACO  Documented Smoking Status Yearly  Screened one or more times in 24 months  Cessation Counseling or  Active cessation medication Past 24 months  Past 24 months   Guideline developer: UpToDate (See UpToDate for funding source) Date Released: 2014       Wellness Office Visit  Subjective:  Patient ID: James Harris, male    DOB: [birth year]  Age: 63 y.o. MRN: [id number]  CC: This is an audio telemedicine visit with the permission of the patient who is at home and I am in my office.  I was able to easily recognize his voice. This is an acute visit with symptoms of dyspnea and wheezing. HPI  This patient is prone to COPD symptoms every several months and he used to be a patient of Dr. Luan Pulling, pulmonologist who is retiring soon.  He describes wheezing, dyspnea and a dry, nonproductive cough without fever.  He denies any myalgias.  To his knowledge, he has not been in contact with anyone with COVID-19 disease.  He has had the symptoms for about 1 week or so. Past Medical History:  Diagnosis Date  . Arthritis   . Cancer ([place])    basal cell on nose  . COPD (chronic obstructive pulmonary disease) ([place])   . Depression    in the past  . Peripheral vascular disease ([place])    blood clot in left leg  . Pneumonia       Family History  Family history unknown: Yes    Social History   Social History Narrative  . Not on file   Social History   Tobacco Use  . Smoking status: Former Research scientist (life sciences)  . Smokeless tobacco: Never Used  Substance Use Topics  . Alcohol use: Yes    Comment: rarely    Current Meds  Medication Sig  . Cholecalciferol (DIALYVITE VITAMIN D 5000) 125 MCG (5000 UT) capsule Take 10,000 Units by mouth daily.  Marland Kitchen escitalopram (LEXAPRO) 10 MG tablet Take 10 mg by mouth daily.  Marland Kitchen HYDROcodone-acetaminophen (NORCO/VICODIN) 5-325 MG tablet Take 1 tablet by mouth every 4 (four) hours as needed for moderate pain ((score 4 to 6)).  Marland Kitchen meloxicam  (MOBIC) 7.5 MG tablet Take 1 tablet (7.5 mg total) by mouth daily.  . Multiple Vitamin (MULTIVITAMIN WITH MINERALS) TABS tablet Take 1 tablet by mouth daily.  . [DISCONTINUED] predniSONE (DELTASONE) 10 MG tablet Take 10 mg by mouth daily.        Objective:   Today's Vitals: There were no vitals taken for this visit. Vitals with BMI 12/21/2018 11/23/2018 11/23/2018  Height (No Data) - -  Weight (No Data) - -  BMI - - -  Systolic (No Data) [id number] [id number]  Diastolic (No Data) 82 89  Pulse - 75 69     Physical Exam  He appeared to be alert and orientated on the phone.     Assessment   1. Chronic obstructive pulmonary disease with acute exacerbation (HCC)       Tests ordered No orders of the defined types were placed in this encounter.    Plan: 1. I will send prescription for tapering dose of prednisone which is a prolonged course lasting over about 1 month.  This is what usually helps him get better.  He knows that he should call us back if he does not improve.   No orders of the defined types were placed in this encounter.   Doree Albee, MD

## 2018-12-22 ENCOUNTER — Other Ambulatory Visit: Payer: Self-pay

## 2018-12-30 DIAGNOSIS — M5412 Radiculopathy, cervical region: Secondary | ICD-10-CM | POA: Diagnosis not present

## 2019-01-18 DIAGNOSIS — B078 Other viral warts: Secondary | ICD-10-CM | POA: Diagnosis not present

## 2019-01-18 DIAGNOSIS — Z85828 Personal history of other malignant neoplasm of skin: Secondary | ICD-10-CM | POA: Diagnosis not present

## 2019-01-18 DIAGNOSIS — L570 Actinic keratosis: Secondary | ICD-10-CM | POA: Diagnosis not present

## 2019-01-18 DIAGNOSIS — L821 Other seborrheic keratosis: Secondary | ICD-10-CM | POA: Diagnosis not present

## 2019-02-02 DIAGNOSIS — M5412 Radiculopathy, cervical region: Secondary | ICD-10-CM | POA: Diagnosis not present

## 2019-02-23 ENCOUNTER — Ambulatory Visit (INDEPENDENT_AMBULATORY_CARE_PROVIDER_SITE_OTHER): Payer: PPO | Admitting: Internal Medicine

## 2019-02-23 ENCOUNTER — Other Ambulatory Visit: Payer: Self-pay

## 2019-02-23 ENCOUNTER — Encounter (INDEPENDENT_AMBULATORY_CARE_PROVIDER_SITE_OTHER): Payer: Self-pay | Admitting: Internal Medicine

## 2019-02-23 VITALS — BP 122/74 | HR 94 | Temp 97.4°F | Resp 18 | Ht 72.0 in | Wt 229.9 lb

## 2019-02-23 DIAGNOSIS — J683 Other acute and subacute respiratory conditions due to chemicals, gases, fumes and vapors: Secondary | ICD-10-CM

## 2019-02-23 DIAGNOSIS — J441 Chronic obstructive pulmonary disease with (acute) exacerbation: Secondary | ICD-10-CM | POA: Diagnosis not present

## 2019-02-23 MED ORDER — PREDNISONE 20 MG PO TABS: 20.0000 mg | ORAL_TABLET | Freq: Every day | ORAL | 1 refills | Status: DC

## 2019-02-23 MED ORDER — LEVOFLOXACIN 750 MG PO TABS: 750.0000 mg | ORAL_TABLET | Freq: Every day | ORAL | 0 refills | Status: AC

## 2019-02-23 NOTE — Progress Notes (Signed)
Metrics: Intervention Frequency ACO  Documented Smoking Status Yearly  Screened one or more times in 24 months  Cessation Counseling or  Active cessation medication Past 24 months  Past 24 months   Guideline developer: UpToDate (See UpToDate for funding source) Date Released: 2014       Wellness Office Visit  Subjective:  Patient ID: James Harris, male    DOB: 09-04-1955  Age: 64 y.o. MRN: [id number]  CC: This man comes in for an acute visit because of dyspnea and wheezing. HPI  He has quite significant COPD and has been given a diagnosis of reactive airways dysfunction syndrome.  When he gets sick, he requires steroids for prolonged periods of time and he was a previous patient of Dr. Luan Pulling. I had had a visit with him in December via telemedicine and had prescribed a prolonged course of steroids but he has really not improved significantly.  He often requires antibiotics also.  He currently has a nonproductive cough but he has been wheezing.  He denies a fever.  He has not been around anyone with COVID-19 and he stays at home most of the time. Past Medical History:  Diagnosis Date  . Arthritis   . Cancer ([place])    basal cell on nose  . COPD (chronic obstructive pulmonary disease) ([place])   . Depression    in the past  . Peripheral vascular disease ([place])    blood clot in left leg  . Pneumonia       Family History  Family history unknown: Yes    Social History   Social History Narrative  . Not on file   Social History   Tobacco Use  . Smoking status: Former Research scientist (life sciences)  . Smokeless tobacco: Never Used  Substance Use Topics  . Alcohol use: Yes    Comment: rarely    Current Meds  Medication Sig  . albuterol (VENTOLIN HFA) 108 (90 Base) MCG/ACT inhaler Inhale 2 puffs into the lungs every 6 (six) hours as needed for wheezing or shortness of breath.  . Cholecalciferol (DIALYVITE VITAMIN D 5000) 125 MCG (5000 UT) capsule Take 10,000 Units by mouth daily.  Marland Kitchen  escitalopram (LEXAPRO) 10 MG tablet Take 10 mg by mouth daily.  . Multiple Vitamin (MULTIVITAMIN WITH MINERALS) TABS tablet Take 1 tablet by mouth daily.       Objective:   Today's Vitals: BP 122/74 (BP Location: Left Arm, Patient Position: Sitting, Cuff Size: Normal)   Pulse 94   Temp (!) 97.4 F (36.3 C) (Temporal)   Resp 18   Ht 6' (1.829 m)   Wt 229 lb 14.4 oz (104.3 kg)   SpO2 94% Comment: wearing mask  BMI 31.18 kg/m  Vitals with BMI 02/23/2019 12/21/2018 11/23/2018  Height 6\' 0"  (No Data) -  Weight 229 lbs 14 oz (No Data) -  BMI [id number] - -  Systolic 123XX123 (No Data) 123XX123  Diastolic 74 (No Data) 82  Pulse 94 - 75     Physical Exam  He does not appear to be in any respiratory distress.  His pulse oximetry on room air is adequate.  Lung fields are clear but he does have wheezing when he coughs.  He does not however significant resting tachycardia.  He is alert and oriented.     Assessment   1. Chronic obstructive pulmonary disease with acute exacerbation ([place])   2. Reactive airways dysfunction syndrome (HCC)       Tests ordered No orders of the defined types  were placed in this encounter.    Plan: 1. I will treat him empirically with high-dose Levaquin for 10 days and prolonged course of prednisone and we will be giving him dexamethasone 8 mg intramuscular in the office today.  This combination often works well for him. 2. I will have him follow-up with Sarah for regular follow-up in about 3 months time.   Meds ordered this encounter  Medications  . levofloxacin (LEVAQUIN) 750 MG tablet    Sig: Take 1 tablet (750 mg total) by mouth daily for 10 days.    Dispense:  10 tablet    Refill:  0  . predniSONE (DELTASONE) 20 MG tablet    Sig: Take 1 tablet (20 mg total) by mouth daily with breakfast. take 2 tablets daily for 3 days,1 1/2 tablets for 3 days,1 tablet daily for 3 days,then 1/2 tablet for 3 weeks    Dispense:  60 tablet    Refill:  1    Myka Lukins Luther Parody, MD

## 2019-03-15 ENCOUNTER — Other Ambulatory Visit: Payer: Self-pay

## 2019-03-15 ENCOUNTER — Encounter (INDEPENDENT_AMBULATORY_CARE_PROVIDER_SITE_OTHER): Payer: Self-pay | Admitting: Internal Medicine

## 2019-03-15 ENCOUNTER — Ambulatory Visit (INDEPENDENT_AMBULATORY_CARE_PROVIDER_SITE_OTHER): Payer: PPO

## 2019-03-15 ENCOUNTER — Ambulatory Visit
Admission: EM | Admit: 2019-03-15 | Discharge: 2019-03-15 | Disposition: A | Discharge status: Home / Self Care | Payer: PPO | Attending: Emergency Medicine | Admitting: Emergency Medicine

## 2019-03-15 ENCOUNTER — Ambulatory Visit (INDEPENDENT_AMBULATORY_CARE_PROVIDER_SITE_OTHER): Payer: PPO | Admitting: Internal Medicine

## 2019-03-15 ENCOUNTER — Ambulatory Visit: Payer: PPO

## 2019-03-15 DIAGNOSIS — R059 Cough, unspecified: Secondary | ICD-10-CM

## 2019-03-15 DIAGNOSIS — J683 Other acute and subacute respiratory conditions due to chemicals, gases, fumes and vapors: Secondary | ICD-10-CM

## 2019-03-15 DIAGNOSIS — R0602 Shortness of breath: Secondary | ICD-10-CM | POA: Diagnosis not present

## 2019-03-15 DIAGNOSIS — J449 Chronic obstructive pulmonary disease, unspecified: Secondary | ICD-10-CM | POA: Diagnosis not present

## 2019-03-15 DIAGNOSIS — R05 Cough: Secondary | ICD-10-CM

## 2019-03-15 DIAGNOSIS — J441 Chronic obstructive pulmonary disease with (acute) exacerbation: Secondary | ICD-10-CM

## 2019-03-15 MED ORDER — DEXAMETHASONE SODIUM PHOSPHATE 10 MG/ML IJ SOLN
10.0000 mg | Freq: Once | INTRAMUSCULAR | Status: AC
  Administered 2019-03-15: 10 mg via INTRAMUSCULAR

## 2019-03-15 MED ORDER — ALBUTEROL SULFATE (2.5 MG/3ML) 0.083% IN NEBU: 2.5000 mg | INHALATION_SOLUTION | Freq: Four times a day (QID) | RESPIRATORY_TRACT | 2 refills | Status: DC | PRN

## 2019-03-15 MED ORDER — PREDNISONE 20 MG PO TABS: 20.0000 mg | ORAL_TABLET | Freq: Two times a day (BID) | ORAL | 0 refills | Status: AC

## 2019-03-15 NOTE — Discharge Instructions (Signed)
Offered patient further evaluation and management in the ED for shortness of breath and cough, cannot rule out blood clot or COVID today in office.  Also unable to administer breathing treatment in office.  Patient declines at this time.  Would like to try outpatient therapy first.  Given strict ED precautions.  Will follow up with PCP in a couple of weeks for recheck.    Declines COVID test at this time.  I have low suspicion given length of symptoms Chest x-ray negative for cardiopulmonary disease Nebulizer given in office Albuterol solution sent to pharmacy on file Prednisone prescribed.  Take as directed and to completion Get plenty of rest and push fluids Follow up with PCP for recheck and/or if symptoms persists Return or go to ER if you have any new or worsening symptoms such as fever, chills, fatigue, shortness of breath, wheezing, chest pain, nausea, changes in bowel or bladder habits, etc..Marland Kitchen

## 2019-03-15 NOTE — ED Provider Notes (Addendum)
[facility]   [id number] 03/15/19 Arrival Time: [id number]  Cc: COUGH and SOB  SUBJECTIVE:  James Harris is a 64 y.o. male hx significant for COPD, who presents with dry cough and SOB x 3 weeks.  Denies positive sick exposure or precipitating event.  Describes cough as intermittent and dry.  Saw PCP on 02/23/19 and treated empirically with decadron, levaquin, and prednisone with minimal to no relief. Symptoms are made worse with deep breath.  Reports previous symptoms in the past with COPD exacerbation, but symptoms normally improve with above treatment.  Complains of mild congestion and fatigue.  Denies fever, chills, sinus pain, rhinorrhea, sore throat, wheezing, chest pain, nausea, vomiting, changes in bowel or bladder habits.    SOB, hemoptysis, calf pain or swelling, recent long travel, recent surgery, hormone use, tobacco use, malignancy.  Reports hx of blood clot in left leg in the past.    ROS: As per HPI.  All other pertinent ROS negative.     Past Medical History:  Diagnosis Date  . Arthritis   . Cancer ([place])    basal cell on nose  . COPD (chronic obstructive pulmonary disease) ([place])   . Depression    in the past  . Peripheral vascular disease ([place])    blood clot in left leg  . Pneumonia    Past Surgical History:  Procedure Laterality Date  . ANTERIOR CERVICAL DECOMP/DISCECTOMY FUSION N/A 11/23/2018   Procedure: Anterior Cervical Discectomy Fusion - Cervical Five-Cervical Six - Cervical Six-Cervical Seven;  Surgeon: Earnie Larsson, MD;  Location: [facility];  Service: Neurosurgery;  Laterality: N/A;  Anterior Cervical Discectomy Fusion - Cervical Five-Cervical Six - Cervical Six-Cervical Seven  . KNEE SURGERY  bilateral  . TONSILLECTOMY     x 2   No Known Allergies No current facility-administered medications on file prior to encounter.   Current Outpatient Medications on File Prior to Encounter  Medication Sig Dispense Refill  . albuterol (VENTOLIN HFA) 108 (90 Base)  MCG/ACT inhaler Inhale 2 puffs into the lungs every 6 (six) hours as needed for wheezing or shortness of breath.    . Cholecalciferol (DIALYVITE VITAMIN D 5000) 125 MCG (5000 UT) capsule Take 10,000 Units by mouth daily.    Marland Kitchen escitalopram (LEXAPRO) 10 MG tablet Take 10 mg by mouth daily.  12  . levalbuterol (XOPENEX HFA) 45 MCG/ACT inhaler Inhale 1 puff into the lungs 3 (three) times daily for 4 days. (Patient taking differently: Inhale 2 puffs into the lungs every 6 (six) hours as needed for wheezing or shortness of breath. ) 1 Inhaler 0  . Multiple Vitamin (MULTIVITAMIN WITH MINERALS) TABS tablet Take 1 tablet by mouth daily.      Social History   Socioeconomic History  . Marital status: Married    Spouse name: Not on file  . Number of children: Not on file  . Years of education: Not on file  . Highest education level: Not on file  Occupational History  . Not on file  Tobacco Use  . Smoking status: Former Research scientist (life sciences)  . Smokeless tobacco: Never Used  Substance and Sexual Activity  . Alcohol use: Yes    Comment: rarely  . Drug use: No  . Sexual activity: Not on file  Other Topics Concern  . Not on file  Social History Narrative  . Not on file   Social Determinants of Health   Financial Resource Strain:   . Difficulty of Paying Living Expenses: Not on file  Food  Insecurity:   . Worried About Charity fundraiser in the Last Year: Not on file  . Ran Out of Food in the Last Year: Not on file  Transportation Needs:   . Lack of Transportation (Medical): Not on file  . Lack of Transportation (Non-Medical): Not on file  Physical Activity:   . Days of Exercise per Week: Not on file  . Minutes of Exercise per Session: Not on file  Stress:   . Feeling of Stress : Not on file  Social Connections:   . Frequency of Communication with Friends and Family: Not on file  . Frequency of Social Gatherings with Friends and Family: Not on file  . Attends Religious Services: Not on file  . Active  Member of Clubs or Organizations: Not on file  . Attends Archivist Meetings: Not on file  . Marital Status: Not on file  Intimate Partner Violence:   . Fear of Current or Ex-Partner: Not on file  . Emotionally Abused: Not on file  . Physically Abused: Not on file  . Sexually Abused: Not on file   Family History  Family history unknown: Yes     OBJECTIVE:  Vitals:   03/15/19 1322  BP: (!) 158/79  Pulse: (!) 108  Resp: (!) 24  Temp: (!) 97.4 F (36.3 C)  SpO2: 95%     General appearance: Alert, appears fatigued, but nontoxic HEENT:NCAT; Ears: EACs clear, TMs pearly gray; Eyes: PERRL.  EOM grossly intact. Nose: nares patent without rhinorrhea; Throat: tonsils nonerythematous or enlarged, uvula midline  Neck: supple without LAD Lungs: Difficult to assess breath sounds due to patient discomfort with deep breaths; no obvious wheezes, rhonchi appreciated, possible decreased breath sounds throughout; mild labored respirations but speaking in full sentences with minimal interruptions; persistent non productive cough present, worse with deep breath Heart: regular rate and rhythm.   Skin: warm and dry Psychological: alert and cooperative; normal mood and affect  DIAGNOSTIC STUDIES:  DG Chest 2 View  Result Date: 03/15/2019 CLINICAL DATA:  Shortness of breath. EXAM: CHEST - 2 VIEW COMPARISON:  05/28/2018 FINDINGS: The heart size and mediastinal contours are within normal limits. Both lungs are clear. The visualized skeletal structures are unremarkable. IMPRESSION: No active cardiopulmonary disease. Electronically Signed   By: Lorriane Shire M.D.   On: 03/15/2019 13:43    X-rays negative for cardiopulmonary disease.    I have reviewed the x-rays myself and the radiologist interpretation. I am in agreement with the radiologist interpretation.     ASSESSMENT & PLAN:  1. COPD exacerbation ([place])   2. SOB (shortness of breath)   3. Cough    Discussed patient case with Dr.  Anastasio Champion, patient's PCP, he recommended longer course of prednisone (40 mg x 10 days). Patient will follow up with PCP in 1-2 weeks for recheck to ensure symptoms are improving.    Meds ordered this encounter  Medications  . predniSONE (DELTASONE) 20 MG tablet    Sig: Take 1 tablet (20 mg total) by mouth 2 (two) times daily with a meal for 10 days.    Dispense:  20 tablet    Refill:  0    Order Specific Question:   Supervising Provider    Answer:   Raylene Everts [id number]  . albuterol (PROVENTIL) (2.5 MG/3ML) 0.083% nebulizer solution    Sig: Take 3 mLs (2.5 mg total) by nebulization every 6 (six) hours as needed for wheezing or shortness of breath.    Dispense:  75 mL    Refill:  2    Order Specific Question:   Supervising Provider    Answer:   Raylene Everts [id number]  . dexamethasone (DECADRON) injection 10 mg    Orders Placed This Encounter  Procedures  . DG Chest 2 View    Standing Status:   Standing    Number of Occurrences:   1    Order Specific Question:   Reason for Exam (SYMPTOM  OR DIAGNOSIS REQUIRED)    Answer:   sob    Offered patient further evaluation and management in the ED for shortness of breath and cough, cannot rule out blood clot or COVID today in office.  Also unable to administer breathing treatment in office.  Patient declines at this time.  Would like to try outpatient therapy first.  Given strict ED precautions.  Will follow up with PCP in a couple of weeks for recheck.    Declines COVID test at this time.  I have low suspicion given length of symptoms Chest x-ray negative for cardiopulmonary disease Nebulizer given in office Albuterol solution sent to pharmacy on file Prednisone prescribed.  Take as directed and to completion Get plenty of rest and push fluids Follow up with PCP for recheck and/or if symptoms persists Return or go to ER if you have any new or worsening symptoms such as fever, chills, fatigue, shortness of breath, wheezing, chest  pain, nausea, changes in bowel or bladder habits, etc...  Reviewed expectations re: course of current medical issues. Questions answered. Outlined signs and symptoms indicating need for more acute intervention. Patient verbalized understanding. After Visit Summary given.     Lestine Box, PA-C 03/15/19 1609

## 2019-03-15 NOTE — Progress Notes (Signed)
Metrics: Intervention Frequency ACO  Documented Smoking Status Yearly  Screened one or more times in 24 months  Cessation Counseling or  Active cessation medication Past 24 months  Past 24 months   Guideline developer: UpToDate (See UpToDate for funding source) Date Released: 2014       Wellness Office Visit  Subjective:  Patient ID: James Harris, male    DOB: 1956/01/03  Age: 64 y.o. MRN: [id number]  CC: This is an audio telemedicine visit with the permission of the patient who is at home and I am in my office.  I was able to recognize his voice and he confirmed with 2 identifiers. Chief complaint is dyspnea.  HPI  I had seen him in the office approximately 3 weeks ago with the same symptoms of dyspnea.  I had prescribed antibiotics and tapering course of prednisone which is a prolonged course of about a month and he still has not improved.  In fact, he told me that his oxygen saturation was down to 89% a couple of days ago but this morning it is better around 95% on room air.  He does not feel he is improving.  He denies any fever. Past Medical History:  Diagnosis Date  . Arthritis   . Cancer ([place])    basal cell on nose  . COPD (chronic obstructive pulmonary disease) ([place])   . Depression    in the past  . Peripheral vascular disease ([place])    blood clot in left leg  . Pneumonia       Family History  Family history unknown: Yes    Social History   Social History Narrative  . Not on file   Social History   Tobacco Use  . Smoking status: Former Research scientist (life sciences)  . Smokeless tobacco: Never Used  Substance Use Topics  . Alcohol use: Yes    Comment: rarely    Current Meds  Medication Sig  . albuterol (VENTOLIN HFA) 108 (90 Base) MCG/ACT inhaler Inhale 2 puffs into the lungs every 6 (six) hours as needed for wheezing or shortness of breath.  . Cholecalciferol (DIALYVITE VITAMIN D 5000) 125 MCG (5000 UT) capsule Take 10,000 Units by mouth daily.  Marland Kitchen escitalopram (LEXAPRO)  10 MG tablet Take 10 mg by mouth daily.  . Multiple Vitamin (MULTIVITAMIN WITH MINERALS) TABS tablet Take 1 tablet by mouth daily.  . predniSONE (DELTASONE) 20 MG tablet Take 1 tablet (20 mg total) by mouth daily with breakfast. take 2 tablets daily for 3 days,1 1/2 tablets for 3 days,1 tablet daily for 3 days,then 1/2 tablet for 3 weeks       Objective:   Today's Vitals: There were no vitals taken for this visit. Vitals with BMI 03/15/2019 02/23/2019 12/21/2018  Height (No Data) 6\' 0"  (No Data)  Weight (No Data) 229 lbs 14 oz (No Data)  BMI - Q000111Q -  Systolic (No Data) 123XX123 (No Data)  Diastolic (No Data) 74 (No Data)  Pulse - 94 -     Physical Exam   His speech is normal on the phone and he appears to be alert and orientated.    Assessment   1. Reactive airways dysfunction syndrome (HCC)       Tests ordered No orders of the defined types were placed in this encounter.    Plan: 1. Since he has not improved after approximately  3 weeks, I have recommended that he go to the urgent care for further evaluation.  He will need a  chest x-ray at the very minimum I believe.  He said he will do this. 2. This phone call lasted 5 minutes and 4 seconds.   No orders of the defined types were placed in this encounter.   Doree Albee, MD

## 2019-03-15 NOTE — ED Triage Notes (Signed)
Pt presents  with c/o sob, pt is tachypnec upon arrival. Pt was seen by pcp 3 weeks ago and treated for copd exacerbation and states he hasn't felt any improvement, p

## 2019-04-15 DIAGNOSIS — J449 Chronic obstructive pulmonary disease, unspecified: Secondary | ICD-10-CM | POA: Diagnosis not present

## 2019-05-15 DIAGNOSIS — J449 Chronic obstructive pulmonary disease, unspecified: Secondary | ICD-10-CM | POA: Diagnosis not present

## 2019-05-24 ENCOUNTER — Ambulatory Visit (INDEPENDENT_AMBULATORY_CARE_PROVIDER_SITE_OTHER): Payer: PPO | Admitting: Nurse Practitioner

## 2019-05-24 ENCOUNTER — Other Ambulatory Visit: Payer: Self-pay

## 2019-05-24 ENCOUNTER — Encounter (INDEPENDENT_AMBULATORY_CARE_PROVIDER_SITE_OTHER): Payer: Self-pay | Admitting: Nurse Practitioner

## 2019-05-24 VITALS — BP 135/65 | HR 82 | Temp 97.3°F | Ht 72.0 in | Wt 238.6 lb

## 2019-05-24 DIAGNOSIS — E559 Vitamin D deficiency, unspecified: Secondary | ICD-10-CM

## 2019-05-24 DIAGNOSIS — F3341 Major depressive disorder, recurrent, in partial remission: Secondary | ICD-10-CM

## 2019-05-24 DIAGNOSIS — J441 Chronic obstructive pulmonary disease with (acute) exacerbation: Secondary | ICD-10-CM | POA: Diagnosis not present

## 2019-05-24 MED ORDER — ALBUTEROL SULFATE (2.5 MG/3ML) 0.083% IN NEBU: 2.5000 mg | INHALATION_SOLUTION | Freq: Four times a day (QID) | RESPIRATORY_TRACT | 2 refills | Status: DC | PRN

## 2019-05-24 NOTE — Progress Notes (Signed)
Subjective:  Patient ID: James Harris, male    DOB: [birth year]  Age: 64 y.o. MRN: [id number]  CC:  Chief Complaint  Patient presents with  . COPD  . Follow-up  . Depression  . Other    Vitamin D deficiency      HPI  This patient arrives today for follow-up of the above.  COPD: He has a history of COPD and experienced an exacerbation approximately 3 months ago.  He was placed on steroids, antibiotics, and nebulizer.  Does not appear that he is on a maintenance inhaler.  He tells me that he has tried multiple inhalers in the past when he was being treated by Dr. Anastasio Champion, however did not experience any significant symptom improvement while on these medications.  Currently he is on albuterol via inhaler or nebulizer as needed.  He is aware that these are the same medications and that he should space out the dose from either 1 by at least 6 hours.  He did not get a flu shot this year and is due for Pneumovax.  He tells me he is not willing to get the COVID-19 vaccine at this time.  Depression: He continues on Lexapro 10 mg daily and feels that the dose is appropriate at this time.  He denies any suicidal ideation.  Vitamin D deficiency: He continues on 10,000 IUs of vitamin D3 daily.  He is due for serum check today.   Past Medical History:  Diagnosis Date  . Arthritis   . Cancer ([place])    basal cell on nose  . COPD (chronic obstructive pulmonary disease) ([place])   . Depression    in the past  . Peripheral vascular disease ([place])    blood clot in left leg  . Pneumonia       Family History  Family history unknown: Yes    Social History   Social History Narrative  . Not on file   Social History   Tobacco Use  . Smoking status: Former Research scientist (life sciences)  . Smokeless tobacco: Never Used  Substance Use Topics  . Alcohol use: Yes    Comment: rarely     Current Meds  Medication Sig  . albuterol (PROVENTIL) (2.5 MG/3ML) 0.083% nebulizer solution Take 3 mLs (2.5 mg total)  by nebulization every 6 (six) hours as needed for wheezing or shortness of breath.  Marland Kitchen albuterol (VENTOLIN HFA) 108 (90 Base) MCG/ACT inhaler Inhale 2 puffs into the lungs every 6 (six) hours as needed for wheezing or shortness of breath.  . Cholecalciferol (DIALYVITE VITAMIN D 5000) 125 MCG (5000 UT) capsule Take 10,000 Units by mouth daily.  Marland Kitchen escitalopram (LEXAPRO) 10 MG tablet Take 10 mg by mouth daily.  . Multiple Vitamin (MULTIVITAMIN WITH MINERALS) TABS tablet Take 1 tablet by mouth daily.  . predniSONE (STERAPRED UNI-PAK 21 TAB) 5 MG (21) TBPK tablet Take 5 mg by mouth daily.  . [DISCONTINUED] albuterol (PROVENTIL) (2.5 MG/3ML) 0.083% nebulizer solution Take 3 mLs (2.5 mg total) by nebulization every 6 (six) hours as needed for wheezing or shortness of breath.    ROS:  Review of Systems  Constitutional: Negative.   Respiratory: Positive for cough, shortness of breath and wheezing. Negative for sputum production.   Cardiovascular: Negative for chest pain ("pressure" at times when his COPD is flaring up).  Psychiatric/Behavioral: Negative for suicidal ideas.     Objective:   Today's Vitals: BP 135/65 (BP Location: Left Arm, Patient Position: Sitting, Cuff Size: Normal)  Pulse 82   Temp (!) 97.3 F (36.3 C) (Temporal)   Ht 6' (1.829 m)   Wt 238 lb 9.6 oz (108.2 kg)   SpO2 96%   BMI 32.36 kg/m  Vitals with BMI 05/24/2019 03/15/2019 03/15/2019  Height '6\' 0"'$  - (No Data)  Weight 238 lbs 10 oz - (No Data)  BMI 10.21 - -  Systolic 117 356 (No Data)  Diastolic 65 79 (No Data)  Pulse 82 108 -     Physical Exam Vitals reviewed.  Constitutional:      Appearance: Normal appearance.  HENT:     Head: Normocephalic and atraumatic.  Cardiovascular:     Rate and Rhythm: Normal rate and regular rhythm.  Pulmonary:     Effort: Pulmonary effort is normal.     Breath sounds: Decreased breath sounds present.     Comments: Cough with deep breathing Musculoskeletal:     Cervical back:  Neck supple.  Skin:    General: Skin is warm and dry.  Neurological:     Mental Status: He is alert and oriented to person, place, and time.  Psychiatric:        Mood and Affect: Mood normal.        Behavior: Behavior normal.        Thought Content: Thought content normal.        Judgment: Judgment normal.          Assessment and Plan   1. Chronic obstructive pulmonary disease with acute exacerbation ([place])   2. Vitamin D deficiency   3. Recurrent major depressive disorder, in partial remission ([place])      Plan: 1.  Via shared decision making the patient will continue on his albuterol inhaler as needed only.  I did discuss that if his symptoms progress then we should consider trialing another maintenance inhaler in the future, he is agreeable to this.  He would like to have a pneumonia vaccine administered today.  He is still to hold off on the COVID-19 vaccine for now.  I encouraged him to get a flu shot for this upcoming flu season was to become available.  He tells me he plans on doing this.  2.  He will continue on his current vitamin D3 supplement we will check serum levels today.  3.  He will continue on his current medication as prescribed.   Tests ordered Orders Placed This Encounter  Procedures  . Pneumococcal polysaccharide vaccine 23-valent greater than or equal to 2yo subcutaneous/IM  . CMP with eGFR([facility])  . Vitamin D, 25-hydroxy      Meds ordered this encounter  Medications  . albuterol (PROVENTIL) (2.5 MG/3ML) 0.083% nebulizer solution    Sig: Take 3 mLs (2.5 mg total) by nebulization every 6 (six) hours as needed for wheezing or shortness of breath.    Dispense:  100 mL    Refill:  2    Order Specific Question:   Supervising Provider    Answer:   Doree Albee [[id number]]    Patient to follow-up in 3 months or sooner as needed.  Ailene Ards, NP

## 2019-06-06 ENCOUNTER — Encounter (INDEPENDENT_AMBULATORY_CARE_PROVIDER_SITE_OTHER): Payer: Self-pay | Admitting: Internal Medicine

## 2019-06-06 ENCOUNTER — Other Ambulatory Visit: Payer: Self-pay

## 2019-06-06 ENCOUNTER — Ambulatory Visit (INDEPENDENT_AMBULATORY_CARE_PROVIDER_SITE_OTHER): Payer: PPO | Admitting: Internal Medicine

## 2019-06-06 VITALS — BP 149/89 | HR 76 | Temp 97.1°F | Resp 20 | Ht 72.0 in | Wt 239.0 lb

## 2019-06-06 DIAGNOSIS — E559 Vitamin D deficiency, unspecified: Secondary | ICD-10-CM | POA: Diagnosis not present

## 2019-06-06 DIAGNOSIS — J683 Other acute and subacute respiratory conditions due to chemicals, gases, fumes and vapors: Secondary | ICD-10-CM | POA: Diagnosis not present

## 2019-06-06 NOTE — Progress Notes (Signed)
Metrics: Intervention Frequency ACO  Documented Smoking Status Yearly  Screened one or more times in 24 months  Cessation Counseling or  Active cessation medication Past 24 months  Past 24 months   Guideline developer: UpToDate (See UpToDate for funding source) Date Released: 2014       Wellness Office Visit  Subjective:  Patient ID: James Harris, male    DOB: 1955-10-01  Age: 64 y.o. MRN: [id number]  CC: This patient comes in as an acute visit with chief complaint of wheezing. HPI  He is known to have reactive airways dysfunction syndrome and often will get worse with triggers.  On this occasion, he went to see drag racing and the fumes seem to have affected him.  He is now wheezing. Past Medical History:  Diagnosis Date  . Arthritis   . Cancer ([place])    basal cell on nose  . COPD (chronic obstructive pulmonary disease) ([place])   . Depression    in the past  . Peripheral vascular disease ([place])    blood clot in left leg  . Pneumonia       Family History  Family history unknown: Yes    Social History   Social History Narrative  . Not on file   Social History   Tobacco Use  . Smoking status: Former Research scientist (life sciences)  . Smokeless tobacco: Never Used  Substance Use Topics  . Alcohol use: Yes    Comment: rarely    Current Meds  Medication Sig  . albuterol (PROVENTIL) (2.5 MG/3ML) 0.083% nebulizer solution Take 3 mLs (2.5 mg total) by nebulization every 6 (six) hours as needed for wheezing or shortness of breath.  Marland Kitchen albuterol (VENTOLIN HFA) 108 (90 Base) MCG/ACT inhaler Inhale 2 puffs into the lungs every 6 (six) hours as needed for wheezing or shortness of breath.  . Cholecalciferol (DIALYVITE VITAMIN D 5000) 125 MCG (5000 UT) capsule Take 10,000 Units by mouth daily.  Marland Kitchen escitalopram (LEXAPRO) 10 MG tablet Take 10 mg by mouth daily.  . Multiple Vitamin (MULTIVITAMIN WITH MINERALS) TABS tablet Take 1 tablet by mouth daily.  . predniSONE (STERAPRED UNI-PAK 21 TAB) 5 MG (21)  TBPK tablet Take 5 mg by mouth daily.      Depression screen [facility] 2/9 05/24/2019  Decreased Interest 0  Down, Depressed, Hopeless 0  PHQ - 2 Score 0     Objective:   Today's Vitals: BP (!) 149/89 (BP Location: Right Arm, Patient Position: Sitting, Cuff Size: Normal)   Pulse 76   Temp (!) 97.1 F (36.2 C) (Temporal)   Resp 20   Ht 6' (1.829 m)   Wt 239 lb (108.4 kg)   SpO2 97%   BMI 32.41 kg/m  Vitals with BMI 06/06/2019 05/24/2019 03/15/2019  Height 6\' 0"  6\' 0"  -  Weight 239 lbs 238 lbs 10 oz -  BMI 99991111 Q000111Q -  Systolic 123456 [id number]  Diastolic 89 65 79  Pulse 76 82 108     Physical Exam  He does not appear to be dyspneic at rest but he clearly is wheezing on examination.  No crackles or bronchial breathing.  He is afebrile clinically.     Assessment   1. Reactive airways dysfunction syndrome (HCC)       Tests ordered No orders of the defined types were placed in this encounter.    Plan: 1. He has been given dexamethasone 8 mg intramuscular in the office today.  He also has oral steroids at home which  she will start taking. 2. He will let me know if he does not improve.  Follow-up as scheduled.   No orders of the defined types were placed in this encounter.   Doree Albee, MD

## 2019-06-07 ENCOUNTER — Other Ambulatory Visit (INDEPENDENT_AMBULATORY_CARE_PROVIDER_SITE_OTHER): Payer: Self-pay | Admitting: Nurse Practitioner

## 2019-06-07 LAB — COMPLETE METABOLIC PANEL WITH GFR
AG Ratio: 2.3 (calc) (ref 1.0–2.5)
ALT: 16 U/L (ref 9–46)
AST: 16 U/L (ref 10–35)
Albumin: 4.2 g/dL (ref 3.6–5.1)
Alkaline phosphatase (APISO): 81 U/L (ref 35–144)
BUN: 15 mg/dL (ref 7–25)
CO2: 25 mmol/L (ref 20–32)
Calcium: 9.1 mg/dL (ref 8.6–10.3)
Chloride: 105 mmol/L (ref 98–110)
Creat: 1.16 mg/dL (ref 0.70–1.25)
GFR, Est African American: 77 mL/min/{1.73_m2} (ref >=60)
GFR, Est Non African American: 67 mL/min/{1.73_m2} (ref >=60)
Globulin: 1.8 g/dL (calc) — ABNORMAL LOW (ref 1.9–3.7)
Glucose, Bld: 196 mg/dL — ABNORMAL HIGH (ref 65–99)
Potassium: 3.6 mmol/L (ref 3.5–5.3)
Sodium: 138 mmol/L (ref 135–146)
Total Bilirubin: 0.7 mg/dL (ref 0.2–1.2)
Total Protein: 6 g/dL — ABNORMAL LOW (ref 6.1–8.1)

## 2019-06-07 LAB — VITAMIN D 25 HYDROXY (VIT D DEFICIENCY, FRACTURES): Vit D, 25-Hydroxy: 101 ng/mL — ABNORMAL HIGH (ref 30–100)

## 2019-06-07 MED ORDER — DIALYVITE VITAMIN D 5000 125 MCG (5000 UT) PO CAPS: 5000.0000 [IU] | ORAL_CAPSULE | Freq: Every day | ORAL | 0 refills | Status: DC

## 2019-06-15 DIAGNOSIS — J449 Chronic obstructive pulmonary disease, unspecified: Secondary | ICD-10-CM | POA: Diagnosis not present

## 2019-07-15 DIAGNOSIS — J449 Chronic obstructive pulmonary disease, unspecified: Secondary | ICD-10-CM | POA: Diagnosis not present

## 2019-08-15 DIAGNOSIS — J449 Chronic obstructive pulmonary disease, unspecified: Secondary | ICD-10-CM | POA: Diagnosis not present

## 2019-08-24 ENCOUNTER — Encounter (INDEPENDENT_AMBULATORY_CARE_PROVIDER_SITE_OTHER): Payer: Self-pay | Admitting: Internal Medicine

## 2019-08-24 ENCOUNTER — Ambulatory Visit (INDEPENDENT_AMBULATORY_CARE_PROVIDER_SITE_OTHER): Payer: PPO | Admitting: Internal Medicine

## 2019-08-24 ENCOUNTER — Other Ambulatory Visit: Payer: Self-pay

## 2019-08-24 VITALS — BP 165/100 | HR 73 | Temp 96.5°F | Ht 72.0 in | Wt 237.6 lb

## 2019-08-24 DIAGNOSIS — J441 Chronic obstructive pulmonary disease with (acute) exacerbation: Secondary | ICD-10-CM

## 2019-08-24 DIAGNOSIS — J683 Other acute and subacute respiratory conditions due to chemicals, gases, fumes and vapors: Secondary | ICD-10-CM | POA: Diagnosis not present

## 2019-08-24 DIAGNOSIS — F3341 Major depressive disorder, recurrent, in partial remission: Secondary | ICD-10-CM

## 2019-08-24 MED ORDER — PREDNISONE 20 MG PO TABS: 20.0000 mg | ORAL_TABLET | Freq: Every day | ORAL | 1 refills | Status: DC

## 2019-08-24 MED ORDER — ESCITALOPRAM OXALATE 10 MG PO TABS: 10.0000 mg | ORAL_TABLET | Freq: Every day | ORAL | 1 refills | Status: DC

## 2019-08-24 NOTE — Progress Notes (Signed)
Metrics: Intervention Frequency ACO  Documented Smoking Status Yearly  Screened one or more times in 24 months  Cessation Counseling or  Active cessation medication Past 24 months  Past 24 months   Guideline developer: UpToDate (See UpToDate for funding source) Date Released: 2014       Wellness Office Visit  Subjective:  Patient ID: James Harris, male    DOB: 1955-12-20  Age: 64 y.o. MRN: [id number]  CC: This man comes in for follow-up of depression, COPD/reactive airways dysfunction syndrome. HPI He is doing reasonably well and feels that the Lexapro maintains his depression very well. As far as his reactive airways dysfunction is concerned, he seems to require prednisone at least 10 mg every day.  If he tries to go below prednisone 10 mg every day, he starts to get problems with his breathing. Since Dr. Luan Pulling retired, he has not seen a pulmonologist.  Past Medical History:  Diagnosis Date  . Arthritis   . Cancer ([place])    basal cell on nose  . COPD (chronic obstructive pulmonary disease) ([place])   . Depression    in the past  . Peripheral vascular disease ([place])    blood clot in left leg  . Pneumonia    Past Surgical History:  Procedure Laterality Date  . ANTERIOR CERVICAL DECOMP/DISCECTOMY FUSION N/A 11/23/2018   Procedure: Anterior Cervical Discectomy Fusion - Cervical Five-Cervical Six - Cervical Six-Cervical Seven;  Surgeon: Earnie Larsson, MD;  Location: [facility];  Service: Neurosurgery;  Laterality: N/A;  Anterior Cervical Discectomy Fusion - Cervical Five-Cervical Six - Cervical Six-Cervical Seven  . KNEE SURGERY  bilateral  . TONSILLECTOMY     x 2     Family History  Family history unknown: Yes    Social History   Social History Narrative  . Not on file   Social History   Tobacco Use  . Smoking status: Former Research scientist (life sciences)  . Smokeless tobacco: Never Used  Substance Use Topics  . Alcohol use: Yes    Comment: rarely    Current Meds  Medication Sig  .  albuterol (PROVENTIL) (2.5 MG/3ML) 0.083% nebulizer solution Take 3 mLs (2.5 mg total) by nebulization every 6 (six) hours as needed for wheezing or shortness of breath.  Marland Kitchen albuterol (VENTOLIN HFA) 108 (90 Base) MCG/ACT inhaler Inhale 2 puffs into the lungs every 6 (six) hours as needed for wheezing or shortness of breath.  . escitalopram (LEXAPRO) 10 MG tablet Take 1 tablet (10 mg total) by mouth daily.  . Multiple Vitamin (MULTIVITAMIN WITH MINERALS) TABS tablet Take 1 tablet by mouth daily.  . [DISCONTINUED] escitalopram (LEXAPRO) 10 MG tablet Take 10 mg by mouth daily.  . [DISCONTINUED] predniSONE (STERAPRED UNI-PAK 21 TAB) 5 MG (21) TBPK tablet Take 5 mg by mouth daily.     Depression screen [facility] 2/9 05/24/2019  Decreased Interest 0  Down, Depressed, Hopeless 0  PHQ - 2 Score 0     Objective:   Today's Vitals: BP (!) 165/100 (BP Location: Left Arm, Patient Position: Sitting, Cuff Size: Normal)   Pulse 73   Temp (!) 96.5 F (35.8 C) (Temporal)   Ht 6' (1.829 m)   Wt 237 lb 9.6 oz (107.8 kg)   SpO2 97%   BMI 32.22 kg/m  Vitals with BMI 08/24/2019 06/06/2019 05/24/2019  Height 6\' 0"  6\' 0"  6\' 0"   Weight 237 lbs 10 oz 239 lbs 238 lbs 10 oz  BMI 32.22 87.56 43.32  Systolic 951 884 166  Diastolic  100 89 65  Pulse 73 76 82     Physical Exam  His blood pressure is somewhat elevated today which is somewhat unusual for him.  He does not appear to be in respiratory distress.     Assessment   1. Reactive airways dysfunction syndrome ([place])   2. Chronic obstructive pulmonary disease with acute exacerbation ([place])   3. Recurrent major depressive disorder, in partial remission ([place])       Tests ordered Orders Placed This Encounter  Procedures  . Ambulatory referral to Pulmonology     Plan: 1. I will refer him to pulmonology locally in [place] for further management of his pulmonary condition.  In the meantime, I have refilled prednisone for him. 2. He will continue with  Lexapro for his depression and I have refilled this also. 3. Today I had a long conversation with him about COVID-19 vaccination and I urged him to be vaccinated now, especially with the more infectious delta variant.  I think he may well consider it now. 4. I will see him in about 3 months time for an annual physical exam.   Meds ordered this encounter  Medications  . escitalopram (LEXAPRO) 10 MG tablet    Sig: Take 1 tablet (10 mg total) by mouth daily.    Dispense:  90 tablet    Refill:  1  . predniSONE (DELTASONE) 20 MG tablet    Sig: Take 1 tablet (20 mg total) by mouth daily with breakfast.    Dispense:  60 tablet    Refill:  1    Benji Poynter Luther Parody, MD

## 2019-09-15 DIAGNOSIS — J449 Chronic obstructive pulmonary disease, unspecified: Secondary | ICD-10-CM | POA: Diagnosis not present

## 2019-10-15 DIAGNOSIS — J449 Chronic obstructive pulmonary disease, unspecified: Secondary | ICD-10-CM | POA: Diagnosis not present

## 2019-10-18 ENCOUNTER — Other Ambulatory Visit: Payer: Self-pay

## 2019-10-18 ENCOUNTER — Ambulatory Visit: Payer: PPO | Admitting: Internal Medicine

## 2019-10-18 ENCOUNTER — Encounter: Payer: Self-pay | Admitting: Internal Medicine

## 2019-10-18 DIAGNOSIS — J45909 Unspecified asthma, uncomplicated: Secondary | ICD-10-CM | POA: Insufficient documentation

## 2019-10-18 DIAGNOSIS — J4531 Mild persistent asthma with (acute) exacerbation: Secondary | ICD-10-CM | POA: Diagnosis not present

## 2019-10-18 MED ORDER — BUDESONIDE-FORMOTEROL FUMARATE 80-4.5 MCG/ACT IN AERO: INHALATION_SPRAY | RESPIRATORY_TRACT | 12 refills | Status: DC

## 2019-10-18 MED ORDER — PANTOPRAZOLE SODIUM 40 MG PO TBEC
DELAYED_RELEASE_TABLET | ORAL | 2 refills | Status: DC
Start: 2019-10-18 — End: 2019-11-14

## 2019-10-18 MED ORDER — METHYLPREDNISOLONE ACETATE 80 MG/ML IJ SUSP
120.0000 mg | Freq: Once | INTRAMUSCULAR | Status: AC
Start: 2019-10-18 — End: 2019-10-18
  Administered 2019-10-18: 120 mg via INTRAMUSCULAR

## 2019-10-18 MED ORDER — TRAMADOL HCL 50 MG PO TABS: 50.0000 mg | ORAL_TABLET | ORAL | 0 refills | Status: AC | PRN

## 2019-10-18 NOTE — Assessment & Plan Note (Signed)
Onset ? 2014 with Pos MCT  07/21/16 and pred dep since ? 2585  - cyclical cough rx 27/07/8240 followed by initiation of symb 80 2bid once quits coughing   DDX of  difficult airways management almost all start with A and  include Adherence, Ace Inhibitors, Acid Reflux, Active Sinus Disease, Alpha 1 Antitripsin deficiency, Anxiety masquerading as Airways dz,  ABPA,  Allergy(esp in young), Aspiration (esp in elderly), Adverse effects of meds,  Active smoking or vaping, A bunch of PE's (a small clot burden can't cause this syndrome unless there is already severe underlying pulm or vascular dz with poor reserve) plus two Bs  = Bronchiectasis and Beta blocker use..and one C= CHF   Adherence is always the initial "prime suspect" and is a multilayered concern that requires a "trust but verify" approach in every patient - starting with knowing how to use medications, especially inhalers, correctly, keeping up with refills and understanding the fundamental difference between maintenance and prns vs those medications only taken for a very short course and then stopped and not refilled.  - instructed on approp use of hfa/ used cream on rash analogy/ empty symbicort canister - return with all meds in hand using a trust but verify approach to confirm accurate Medication  Reconciliation The principal here is that until we are certain that the  patients are doing what we've asked, it makes no sense to ask them to do more.   ? Allergy / asthma > start symb 80 2bid once cough better and minimize saba - I spent extra time with pt today reviewing appropriate use of albuterol for prn use on exertion with the following points: 1) saba is for relief of sob that does not improve by walking a slower pace or resting but rather if the pt does not improve after trying this first. 2) If the pt is convinced, as many are, that saba helps recover from activity faster then it's easy to tell if this is the case by re-challenging : ie stop,  take the inhaler, then p 5 minutes try the exact same activity (intensity of workload) that just caused the symptoms and see if they are substantially diminished or not after saba 3) if there is an activity that reproducibly causes the symptoms, try the saba 15 min before the activity on alternate days   If in fact the saba really does help, then fine to continue to use it prn but advised may need to look closer at the maintenance regimen being used to achieve better control of airways disease with exertion.    ? Acid (or non-acid) GERD > always difficult to exclude as up to 75% of pts in some series report no assoc GI/ Heartburn symptoms> rec max (24h)  acid suppression and diet restrictions/ reviewed and instructions given in writing.  - Of the three most common causes of  Sub-acute / recurrent or chronic cough, only one (GERD)  can actually contribute to/ trigger  the other two (asthma and post nasal drip syndrome)  and perpetuate the cylce of cough.  While not intuitively obvious, many patients with chronic low grade reflux do not cough until there is a primary insult that disturbs the protective epithelial barrier and exposes sensitive nerve endings.   This is typically viral but can due to PNDS and  either may apply here.   The point is that once this occurs, it is difficult to eliminate the cycle  using anything but a maximally effective acid suppression  regimen at least in the short run, accompanied by an appropriate diet to address non acid GERD and control / eliminate the cough itself for at least 3 days with tramadol and taper pred back to 10  Mg daily   ? Anxiety/depression/deconditioning with wt gain  > usually at the bottom of this list of usual suspects but should be much higher on this pt's based on H and P and note already on psychotropics and may interfere with adherence and also interpretation of response or lack thereof to symptom management which can be quite subjective.   ? ABPA >  needs eos/ IgE as taper pred down/ off   ? Adverse effects of meds > none of the usual suspects listed, so needs to return with all meds to verify not taking any / note daughter works in pharmacy and supplies some meds but  I believe than are only various forms of saba   ? CHF > no evidence clinically    Pt informed of the seriousness of COVID 19 infection as a direct risk to lung health  and safey and to close contacts and should continue to wear a facemask in public and minimize exposure to public locations but especially avoid any area or activity where non-close contacts are not observing distancing or wearing an appropriate face mask.  I strongly recommended she take either of the vaccines available through local drugstores based on updated information on millions of Americans treated with the [place] products  which have proven both safe and  effective even against the new delta variant. Says he knows people who died after taking vaccine both from the vaccine and from the virus so declines but advised he at least check his IgG to see if he has any immunity at all.           Each maintenance medication was reviewed in detail including emphasizing most importantly the difference between maintenance and prns and under what circumstances the prns are to be triggered using an action plan format where appropriate.  Total time for H and P, chart review, counseling, teaching device and generating customized AVS unique to this office visit / charting   = 68 min

## 2019-10-18 NOTE — Progress Notes (Signed)
James Harris, male    DOB: 1955/03/26,   MRN: [id number]   Brief patient profile:  70 yowm  Daughter is pharmacist Quit smoking  1986 with variably  sob staring 2014 since working [facility] where exposed to chemicals but taken out of work by James Harris for RAD around 2019  and since then variably worse s  and pred dependent since around 2020 @ 10 mg per day  so referred to pulmonary clinic in [place]  10/18/2019 by Dr  James Harris .   History of Present Illness  10/18/2019  Pulmonary/ 1st Harris eval/ James Harris during typical flare onset x 10 days/ unvaccinated for covid 52 Chief Complaint  Patient presents with  . Pulmonary Consult    Referred by Dr James Harris,  Former pt of Dr James Harris. Pt c/o SOB over the past 5 years- worse over the past 10 days. He states unable to lie down due to SOB. He rarely uses albuterol inhaler but has been using his albuterol neb about 2 x per wk.   Dyspnea:  When better no need for albuterol/ no regular walking  Cough: assoc with flares including 10 days prior to OV  And increased pred to 40 and better by ov  Sleep: able to sleep flat when not flaring/ o/w in recliner at 45 degrees  SABA use: changes from hfa to nebulizer when starts getting sick as hfa not effective  No obvious day to day or daytime variability or assoc excess/ purulent sputum or mucus plugs or hemoptysis or cp or chest tightness, subjective wheeze or overt sinus or hb symptoms.   Sleeping  without nocturnal  or early am exacerbation  of respiratory  c/o's or need for noct saba. Also denies any obvious fluctuation of symptoms with weather or environmental changes or other aggravating or alleviating factors except as outlined above   No unusual exposure hx or h/o childhood pna/ asthma or knowledge of premature birth.  Current Allergies, Complete Past Medical History, Past Surgical History, Family History, and Social History were reviewed in [employer]  record.  ROS  The following are not active complaints unless bolded Hoarseness, sore throat, dysphagia, dental problems, itching, sneezing,  nasal congestion or discharge of excess mucus or purulent secretions, ear ache,   fever, chills, sweats, unintended wt loss or wt gain, classically pleuritic or exertional cp,  orthopnea pnd or arm/hand swelling  or leg swelling, presyncope, palpitations, abdominal pain, anorexia, nausea, vomiting, diarrhea  or change in bowel habits or change in bladder habits, change in stools or change in urine, dysuria, hematuria,  rash, arthralgias, visual complaints, headache, numbness, weakness or ataxia or problems with walking or coordination,  change in mood or  memory.            Past Medical History:  Diagnosis Date  . Arthritis   . Cancer ([place])    basal cell on nose  . COPD (chronic obstructive pulmonary disease) ([place])   . Depression    in the past  . Peripheral vascular disease ([place])    blood clot in left leg  . Pneumonia     Outpatient Medications Prior to Visit  Medication Sig Dispense Refill  . albuterol (PROVENTIL) (2.5 MG/3ML) 0.083% nebulizer solution Take 3 mLs (2.5 mg total) by nebulization every 6 (six) hours as needed for wheezing or shortness of breath. 100 mL 2  . albuterol (VENTOLIN HFA) 108 (90 Base) MCG/ACT inhaler Inhale 2 puffs into the lungs every 6 (six)  hours as needed for wheezing or shortness of breath.    . cholecalciferol (VITAMIN D3) 25 MCG (1000 UNIT) tablet Take 1,000 Units by mouth daily.    Marland Kitchen escitalopram (LEXAPRO) 10 MG tablet Take 1 tablet (10 mg total) by mouth daily. 90 tablet 1  . Multiple Vitamin (MULTIVITAMIN WITH MINERALS) TABS tablet Take 1 tablet by mouth daily.    . predniSONE (DELTASONE) 20 MG tablet Take 1 tablet (20 mg total) by mouth daily with breakfast. 60 tablet 1   No facility-administered medications prior to visit.     Objective:     BP 140/80 (BP Location: Left Arm, Cuff Size: Normal)   Pulse  86   Temp (!) 97.4 F (36.3 C) (Temporal)   Ht 6' (1.829 m)   Wt 238 lb (108 kg)   SpO2 96% Comment: on RA  BMI 32.28 kg/m   SpO2: 96 % (on RA)   amb mod obese wm with extremely shrill upper airway pattern cough early on inspiration   HEENT : pt wearing mask not removed for exam due to covid -19 concerns.    NECK :  without JVD/Nodes/TM/ nl carotid upstrokes bilaterally   LUNGS: no acc muscle use,  Nl contour chest which is clear to A and P bilaterally without cough on insp or exp maneuvers   CV:  RRR  no s3 or murmur or increase in P2, and no edema   ABD: obese / soft and nontender with nl inspiratory excursion in the supine position. No bruits or organomegaly appreciated, bowel sounds nl  MS:  Nl gait/ ext warm without deformities, calf tenderness, cyanosis or clubbing No obvious joint restrictions   SKIN: warm and dry without lesions    NEURO:  alert, approp, nl sensorium with  no motor or cerebellar deficits apparent.          Assessment   Reactive airways disease Onset ? 2014 with Pos MCT  07/21/16 and pred dep since ? 8101  - cyclical cough rx 75/01/256 followed by initiation of symb 80 2bid once quits coughing   DDX of  difficult airways management almost all start with A and  include Adherence, Ace Inhibitors, Acid Reflux, Active Sinus Disease, Alpha 1 Antitripsin deficiency, Anxiety masquerading as Airways dz,  ABPA,  Allergy(esp in young), Aspiration (esp in elderly), Adverse effects of meds,  Active smoking or vaping, A bunch of PE's (a small clot burden can't cause this syndrome unless there is already severe underlying pulm or vascular dz with poor reserve) plus two Bs  = Bronchiectasis and Beta blocker use..and one C= CHF   Adherence is always the initial "prime suspect" and is a multilayered concern that requires a "trust but verify" approach in every patient - starting with knowing how to use medications, especially inhalers, correctly, keeping up with  refills and understanding the fundamental difference between maintenance and prns vs those medications only taken for a very short course and then stopped and not refilled.  - instructed on approp use of hfa/ used cream on rash analogy/ empty symbicort canister - return with all meds in hand using a trust but verify approach to confirm accurate Medication  Reconciliation The principal here is that until we are certain that the  patients are doing what we've asked, it makes no sense to ask them to do more.   ? Allergy / asthma > start symb 80 2bid once cough better and minimize saba - I spent extra time with pt today reviewing  appropriate use of albuterol for prn use on exertion with the following points: 1) saba is for relief of sob that does not improve by walking a slower pace or resting but rather if the pt does not improve after trying this first. 2) If the pt is convinced, as many are, that saba helps recover from activity faster then it's easy to tell if this is the case by re-challenging : ie stop, take the inhaler, then p 5 minutes try the exact same activity (intensity of workload) that just caused the symptoms and see if they are substantially diminished or not after saba 3) if there is an activity that reproducibly causes the symptoms, try the saba 15 min before the activity on alternate days   If in fact the saba really does help, then fine to continue to use it prn but advised may need to look closer at the maintenance regimen being used to achieve better control of airways disease with exertion.    ? Acid (or non-acid) GERD > always difficult to exclude as up to 75% of pts in some series report no assoc GI/ Heartburn symptoms> rec max (24h)  acid suppression and diet restrictions/ reviewed and instructions given in writing.  - Of the three most common causes of  Sub-acute / recurrent or chronic cough, only one (GERD)  can actually contribute to/ trigger  the other two (asthma and post  nasal drip syndrome)  and perpetuate the cylce of cough.  While not intuitively obvious, many patients with chronic low grade reflux do not cough until there is a primary insult that disturbs the protective epithelial barrier and exposes sensitive nerve endings.   This is typically viral but can due to PNDS and  either may apply here.   The point is that once this occurs, it is difficult to eliminate the cycle  using anything but a maximally effective acid suppression regimen at least in the short run, accompanied by an appropriate diet to address non acid GERD and control / eliminate the cough itself for at least 3 days with tramadol and taper pred back to 10  Mg daily   ? Anxiety/depression/deconditioning with wt gain  > usually at the bottom of this list of usual suspects but should be much higher on this pt's based on H and P and note already on psychotropics and may interfere with adherence and also interpretation of response or lack thereof to symptom management which can be quite subjective.   ? ABPA > needs eos/ IgE as taper pred down/ off   ? Adverse effects of meds > none of the usual suspects listed, so needs to return with all meds to verify not taking any / note daughter works in pharmacy and supplies some meds but  I believe than are only various forms of saba   ? CHF > no evidence clinically    Pt informed of the seriousness of COVID 19 infection as a direct risk to lung health  and safey and to close contacts and should continue to wear a facemask in public and minimize exposure to public locations but especially avoid any area or activity where non-close contacts are not observing distancing or wearing an appropriate face mask.  I strongly recommended she take either of the vaccines available through local drugstores based on updated information on millions of Americans treated with the [place] products  which have proven both safe and  effective even against the new delta  variant. Says  he knows people who died after taking vaccine both from the vaccine and from the virus so declines but advised he at least check his IgG to see if he has any immunity at all.           Each maintenance medication was reviewed in detail including emphasizing most importantly the difference between maintenance and prns and under what circumstances the prns are to be triggered using an action plan format where appropriate.  Total time for H and P, chart review, counseling, teaching device and generating customized AVS unique to this Harris visit / charting   = 68 min           Christinia Gully, MD 10/18/2019

## 2019-10-18 NOTE — Patient Instructions (Addendum)
Prednisone 20 mg one daily until better then one half pill, if worse start over   Pantoprazole (protonix) 40 mg  Take 30- 60 min before your first and last meals of the day   GERD (REFLUX)  is an extremely common cause of respiratory symptoms just like yours , many times with no obvious heartburn at all.    It can be treated with medication, but also with lifestyle changes including elevation of the head of your bed (ideally with 6 -8inch blocks under the headboard of your bed),  Smoking cessation, avoidance of late meals, excessive alcohol, and avoid fatty foods, chocolate, peppermint, colas, red wine, and acidic juices such as orange juice.  NO MINT OR MENTHOL PRODUCTS SO NO COUGH DROPS  USE SUGARLESS CANDY INSTEAD (Jolley ranchers or Stover's or Life Savers) or even ice chips will also do - the key is to swallow to prevent all throat clearing. NO OIL BASED VITAMINS - use powdered substitutes.  Avoid fish oil when coughing.  Depomedrol 120 mg IM   Until you stop coughing>  Nebulizer albuterol up to every 4 hours as needed   Once the cough is gone >> Symbicort 80 Take 2 puffs first thing in am and then another 2 puffs about 12 hours later.   Take delsym two tsp every 12 hours and supplement if needed with  tramadol 50 mg up to 1- 2 every 4 hours to suppress the urge to cough. Swallowing water and/or using ice chips/non mint and menthol containing candies (such as lifesavers or sugarless jolly ranchers) are also effective.  You should rest your voice and avoid activities that you know make you cough.  Once you have eliminated the cough for 3 straight days try reducing the tramadol first,  then the delsym as tolerated.    I strongly you be checked for IgG next lab draw    I very strongly recommend you get the moderna or pfizer vaccine as soon as possible based on your risk of dying from the virus  and the proven safety and benefit of these vaccines against even the delta variant.  This can save  your life as well as  those of your loved ones,  especially if they are also not vaccinated.       Please schedule a follow up office visit in 4 weeks, sooner if needed  with all medications /inhalers/ solutions in hand so we can verify exactly what you are taking. This includes all medications from all doctors and over the counters

## 2019-11-12 ENCOUNTER — Other Ambulatory Visit: Payer: Self-pay | Admitting: Internal Medicine

## 2019-11-15 DIAGNOSIS — J449 Chronic obstructive pulmonary disease, unspecified: Secondary | ICD-10-CM | POA: Diagnosis not present

## 2019-11-17 ENCOUNTER — Encounter: Payer: Self-pay | Admitting: Internal Medicine

## 2019-11-17 ENCOUNTER — Ambulatory Visit: Payer: PPO | Admitting: Internal Medicine

## 2019-11-17 ENCOUNTER — Other Ambulatory Visit: Payer: Self-pay

## 2019-11-17 VITALS — BP 148/92 | HR 78 | Temp 97.3°F | Ht 72.0 in | Wt 239.4 lb

## 2019-11-17 DIAGNOSIS — J4530 Mild persistent asthma, uncomplicated: Secondary | ICD-10-CM | POA: Diagnosis not present

## 2019-11-17 DIAGNOSIS — Z23 Encounter for immunization: Secondary | ICD-10-CM | POA: Diagnosis not present

## 2019-11-17 NOTE — Patient Instructions (Addendum)
Prednisone 20 mg one daily until better then one half pill, if worse start over   Pantoprazole (protonix) 40 mg  Take 30- 60 min before your first and last meals of the day   GERD (REFLUX)  is an extremely common cause of respiratory symptoms just like yours , many times with no obvious heartburn at all.    It can be treated with medication, but also with lifestyle changes including elevation of the head of your bed (ideally with 6 -8inch blocks under the headboard of your bed),  Smoking cessation, avoidance of late meals, excessive alcohol, and avoid fatty foods, chocolate, peppermint, colas, red wine, and acidic juices such as orange juice.  NO MINT OR MENTHOL PRODUCTS SO NO COUGH DROPS  USE SUGARLESS CANDY INSTEAD (Jolley ranchers or Stover's or Life Savers) or even ice chips will also do - the key is to swallow to prevent all throat clearing. NO OIL BASED VITAMINS - use powdered substitutes.  Avoid fish oil when coughing.  To get the most out of exercise, you need to be continuously aware that you are short of breath, but never out of breath, for 30 minutes daily. As you improve, it will actually be easier for you to do the same amount of exercise  in  30 minutes so always push to the level where you are short of breath.   We will set you up with an allergist to consider biologics to treat your asthma    I very strongly recommend you get the moderna or pfizer vaccine as soon as possible based on your risk of dying from the virus  and the proven safety and benefit of these vaccines against even the delta variant.  This can save your life as well as  those of your loved ones,  especially if they are also not vaccinated.       Pulmonary follow up as needed

## 2019-11-17 NOTE — Progress Notes (Signed)
James Harris, male    DOB: 07/04/1955,   MRN: [id number]   Brief patient profile:  58 yowm  Daughter is pharmacist Quit smoking  1986 with variably  sob staring 2014 since working [employer] where exposed to chemicals but taken out of work by [employer] for RAD around 2019  and since then variably worse   and pred dependent since around 2020 @ 10 mg per day  so referred to pulmonary clinic in [place]  10/18/2019 by Dr  Anastasio Champion .   History of Present Illness  10/18/2019  Pulmonary/ 1st office eval/ James [facility] / [facility] during typical flare onset x 10 days/ unvaccinated for covid 56 Chief Complaint  Patient presents with  . Pulmonary Consult    Referred by Dr Anastasio Champion,  Former pt of Dr Luan Pulling. Pt c/o SOB over the past 5 years- worse over the past 10 days. He states unable to lie down due to SOB. He rarely uses albuterol inhaler but has been using his albuterol neb about 2 x per wk.   Dyspnea:  When better no need for albuterol/ no regular walking  Cough: assoc with flares including 10 days prior to OV  And increased pred to 40 and better by ov  Sleep: able to sleep flat when not flaring/ o/w in recliner at 45 degrees  SABA use: changes from hfa to nebulizer when starts getting sick as hfa not effective rec Prednisone 20 mg one daily until better then one half pill, if worse start over  Pantoprazole (protonix) 40 mg  Take 30- 60 min before your first and last meals of the day  GERD diet  Depomedrol 120 mg IM  Until you stop coughing>  Nebulizer albuterol up to every 4 hours as needed  Once the cough is gone >> Symbicort 80 Take 2 puffs first thing in am and then another 2 puffs about 12 hours later.  Take delsym two tsp every 12 hours and supplement if needed with  tramadol 50 mg up to 1- 2 every 4 hours to suppress the urge to cough.   I strongly you be checked for covid  IgG next lab draw if you thin you have natural immunity Please schedule a follow up office visit in 4 weeks, sooner  if needed  with all medications /inhalers/ solutions in hand so we can verify exactly what you are taking. This includes all medications from all doctors and over the counters    11/17/2019  f/u ov/[place] office/James [facility] re: RAD/ did not bring meds as requested  Could not take symbicort due to cramps but did not call, pred still at 20 mg  Chief Complaint  Patient presents with  . Follow-up    shortness of breath with activity  Dyspnea:  30 min flat surface  2-3 x per week  Cough: better p rx with tramadol, no longer using, still cough on exp Sleeping: flat / did not do bed blocks as rec SABA use: hardly ever neb  02: none    No obvious day to day or daytime variability or assoc excess/ purulent sputum or mucus plugs or hemoptysis or cp or chest tightness, subjective wheeze or overt sinus or hb symptoms.   Sleeping as above without nocturnal  or early am exacerbation  of respiratory  c/o's or need for noct saba. Also denies any obvious fluctuation of symptoms with weather or environmental changes or other aggravating or alleviating factors except as outlined above   No unusual exposure hx or  h/o childhood pna/ asthma or knowledge of premature birth.  Current Allergies, Complete Past Medical History, Past Surgical History, Family History, and Social History were reviewed in [employer] record.  ROS  The following are not active complaints unless bolded Hoarseness, sore throat, dysphagia, dental problems, itching, sneezing,  nasal congestion or discharge of excess mucus or purulent secretions, ear ache,   fever, chills, sweats, unintended wt loss or wt gain, classically pleuritic or exertional cp,  orthopnea pnd or arm/hand swelling  or leg swelling, presyncope, palpitations, abdominal pain, anorexia, nausea, vomiting, diarrhea  or change in bowel habits or change in bladder habits, change in stools or change in urine, dysuria, hematuria,  rash, arthralgias, visual  complaints, headache, numbness, weakness or ataxia or problems with walking or coordination,  change in mood or  memory.        Current Meds  Medication Sig  . albuterol (PROVENTIL) (2.5 MG/3ML) 0.083% nebulizer solution Take 3 mLs (2.5 mg total) by nebulization every 6 (six) hours as needed for wheezing or shortness of breath.  Marland Kitchen albuterol (VENTOLIN HFA) 108 (90 Base) MCG/ACT inhaler Inhale 2 puffs into the lungs every 6 (six) hours as needed for wheezing or shortness of breath.  . escitalopram (LEXAPRO) 10 MG tablet Take 1 tablet (10 mg total) by mouth daily.  . Multiple Vitamin (MULTIVITAMIN WITH MINERALS) TABS tablet Take 1 tablet by mouth daily.  . pantoprazole (PROTONIX) 40 MG tablet TAKE 30-60 MINUTES BEFORE YOUR FIRST MEAL AND LAST MEAL OF THE DAY  . predniSONE (DELTASONE) 20 MG tablet Take 1 tablet (20 mg total) by mouth daily with breakfast.                          Past Medical History:  Diagnosis Date  . Arthritis   . Cancer ([place])    basal cell on nose  . COPD (chronic obstructive pulmonary disease) ([place])   . Depression    in the past  . Peripheral vascular disease ([place])    blood clot in left leg  . Pneumonia         Objective:    Wt Readings from Last 3 Encounters:  11/17/19 239 lb 6.4 oz (108.6 kg)  10/18/19 238 lb (108 kg)  08/24/19 237 lb 9.6 oz (107.8 kg)     Vital signs reviewed - Note on arrival 11/17/2019  02 sats  99% on RA         amb obese wm with classic pseudowheeze made worse by exhaling well beyond FRC then starting up with violent dry hacking cough.   HEENT : pt wearing mask not removed for exam due to covid -19 concerns.    NECK :  without JVD/Nodes/TM/ nl carotid upstrokes bilaterally   LUNGS: no acc muscle use,  Nl contour chest which is clear to A and P bilaterally without cough on insp or exp maneuvers   CV:  RRR  no s3 or murmur or increase in P2, and no edema   ABD:  Quite obese but soft and nontender with nl  inspiratory excursion in the supine position. No bruits or organomegaly appreciated, bowel sounds nl  MS:  Nl gait/ ext warm without deformities, calf tenderness, cyanosis or clubbing No obvious joint restrictions   SKIN: warm and dry without lesions    NEURO:  alert, approp, nl sensorium with  no motor or cerebellar deficits apparent.       Assessment

## 2019-11-18 ENCOUNTER — Encounter: Payer: Self-pay | Admitting: Internal Medicine

## 2019-11-18 NOTE — Assessment & Plan Note (Addendum)
Onset ? 2014 with Pos MCT  07/21/16 and pred dep since ? 2575  - cyclical cough rx 05/13/8333 followed by initiation of symb 80 2bid once quits coughing > could not tolerate the cramping / made him cough and did not reduce pred need or any tangible symptom benefit  Not able to wean prednisone low enough to do EOS/ IgE to consider qualification for biologics and suspect developing a secondary problem now with pred causing wt gain/ gerd contributing to cough and doe.  Also clearly not following instructions with the urgency he needs to reduce his prednisone dependency so sending to allergy for second opinion and ? Options for steroid sparing effects of biologics if qualifies.  Discussed in detail all the  indications, usual  risks and alternatives  relative to the benefits with patient who agrees to proceed with w/u and Rx as outlined.            Each maintenance medication was reviewed in detail including emphasizing most importantly the difference between maintenance and prns and under what circumstances the prns are to be triggered using an action plan format where appropriate.  Total time for H and P, chart review, counseling,   and generating customized AVS unique to this office visit / charting = 21 min

## 2019-11-24 ENCOUNTER — Encounter (INDEPENDENT_AMBULATORY_CARE_PROVIDER_SITE_OTHER): Payer: PPO | Admitting: Internal Medicine

## 2019-12-12 ENCOUNTER — Other Ambulatory Visit: Payer: Self-pay

## 2019-12-12 ENCOUNTER — Ambulatory Visit (INDEPENDENT_AMBULATORY_CARE_PROVIDER_SITE_OTHER): Payer: PPO | Admitting: Internal Medicine

## 2019-12-12 ENCOUNTER — Encounter (INDEPENDENT_AMBULATORY_CARE_PROVIDER_SITE_OTHER): Payer: Self-pay | Admitting: Internal Medicine

## 2019-12-12 VITALS — BP 120/60 | HR 72 | Resp 18 | Ht 72.0 in | Wt 242.0 lb

## 2019-12-12 DIAGNOSIS — F3341 Major depressive disorder, recurrent, in partial remission: Secondary | ICD-10-CM

## 2019-12-12 DIAGNOSIS — J683 Other acute and subacute respiratory conditions due to chemicals, gases, fumes and vapors: Secondary | ICD-10-CM

## 2019-12-12 DIAGNOSIS — R1011 Right upper quadrant pain: Secondary | ICD-10-CM

## 2019-12-12 DIAGNOSIS — E559 Vitamin D deficiency, unspecified: Secondary | ICD-10-CM | POA: Diagnosis not present

## 2019-12-12 NOTE — Progress Notes (Signed)
Metrics: Intervention Frequency ACO  Documented Smoking Status Yearly  Screened one or more times in 24 months  Cessation Counseling or  Active cessation medication Past 24 months  Past 24 months   Guideline developer: UpToDate (See UpToDate for funding source) Date Released: 2014       Wellness Office Visit  Subjective:  Patient ID: James Harris, male    DOB: 12-Jan-1956  Age: 64 y.o. MRN: [id number]  CC: This man was scheduled for an annual physical today but, due to time constraints, we have decided to postpone this for another time, as he had more pressing concerns regarding his symptoms. He is worried about right upper quadrant abdominal pain and also elevated blood pressure at home. HPI  He had been weaning himself off steroids and today was the first day he did not have a headache and I suspect he may have had elevated blood pressure due to the steroids.  He went to see pulmonologist and wanted my opinion regarding whether he should see an allergist.  I told him this was a good idea. The right upper quadrant pain has been going on for the last month or so and appears to be a colicky type of pain, most often after eating food.  He denies any nausea or vomiting with it. Past Medical History:  Diagnosis Date  . Arthritis   . Cancer ([place])    basal cell on nose  . COPD (chronic obstructive pulmonary disease) ([place])   . Depression    in the past  . Peripheral vascular disease ([place])    blood clot in left leg  . Pneumonia    Past Surgical History:  Procedure Laterality Date  . ANTERIOR CERVICAL DECOMP/DISCECTOMY FUSION N/A 11/23/2018   Procedure: Anterior Cervical Discectomy Fusion - Cervical Five-Cervical Six - Cervical Six-Cervical Seven;  Surgeon: Earnie Larsson, MD;  Location: [facility];  Service: Neurosurgery;  Laterality: N/A;  Anterior Cervical Discectomy Fusion - Cervical Five-Cervical Six - Cervical Six-Cervical Seven  . KNEE SURGERY  bilateral  . TONSILLECTOMY     x 2      Family History  Family history unknown: Yes    Social History   Social History Narrative  . Not on file   Social History   Tobacco Use  . Smoking status: Former Smoker    Packs/day: 1.50    Years: 20.00    Pack years: 30.00    Types: Cigarettes    Quit date: 01/14/1984    Years since quitting: 35.9  . Smokeless tobacco: Never Used  Substance Use Topics  . Alcohol use: Yes    Comment: rarely    Current Meds  Medication Sig  . albuterol (PROVENTIL) (2.5 MG/3ML) 0.083% nebulizer solution Take 3 mLs (2.5 mg total) by nebulization every 6 (six) hours as needed for wheezing or shortness of breath.  Marland Kitchen albuterol (VENTOLIN HFA) 108 (90 Base) MCG/ACT inhaler Inhale 2 puffs into the lungs every 6 (six) hours as needed for wheezing or shortness of breath.  . Cholecalciferol (VITAMIN D-3) 125 MCG (5000 UT) TABS Take 2 tablets by mouth daily.  Marland Kitchen escitalopram (LEXAPRO) 10 MG tablet Take 1 tablet (10 mg total) by mouth daily.  . Multiple Vitamin (MULTIVITAMIN WITH MINERALS) TABS tablet Take 1 tablet by mouth daily.  . pantoprazole (PROTONIX) 40 MG tablet TAKE 30-60 MINUTES BEFORE YOUR FIRST MEAL AND LAST MEAL OF THE DAY  . [DISCONTINUED] cholecalciferol (VITAMIN D3) 25 MCG (1000 UNIT) tablet Take 1,000 Units by mouth daily.  Depression screen [facility] 2/9 12/12/2019 05/24/2019  Decreased Interest 0 0  Down, Depressed, Hopeless 0 0  PHQ - 2 Score 0 0  Altered sleeping 0 -  Tired, decreased energy 0 -  Change in appetite 0 -  Feeling bad or failure about yourself  0 -  Trouble concentrating 0 -  Moving slowly or fidgety/restless 0 -  Suicidal thoughts 0 -  PHQ-9 Score 0 -     Objective:   Today's Vitals: BP 120/60   Pulse 72   Resp 18   Ht 6' (1.829 m)   Wt 242 lb (109.8 kg)   SpO2 97%   BMI 32.82 kg/m  Vitals with BMI 12/12/2019 11/17/2019 10/18/2019  Height 6\' 0"  6\' 0"  6\' 0"   Weight 242 lbs 239 lbs 6 oz 238 lbs  BMI 32.81 34.19 62.22  Systolic 979 892 119   Diastolic 60 92 80  Pulse 72 78 86     Physical Exam He looks systemically well and not in pain.  Blood pressure is actually in a good range today.  Examination of his abdomen does not show any Murphy sign.  There is no obvious hepatomegaly at the site.      Assessment   1. Reactive airways dysfunction syndrome ([place])   2. Recurrent major depressive disorder, in partial remission ([place])   3. Vitamin D deficiency   4. RUQ pain       Tests ordered Orders Placed This Encounter  Procedures  . US Abdomen Limited RUQ (LIVER/GB)     Plan: 1. We will schedule an ultrasound of the right upper quadrant to evaluate his gallbladder further. 2. His blood pressure is acceptable and normal today so he is reassured. 3. Follow-up in 2 months when we will do all blood work.  We discussed COVID-19 vaccination once again but he seems to be still reluctant.   No orders of the defined types were placed in this encounter.   Doree Albee, MD

## 2019-12-15 DIAGNOSIS — J449 Chronic obstructive pulmonary disease, unspecified: Secondary | ICD-10-CM | POA: Diagnosis not present

## 2019-12-20 ENCOUNTER — Ambulatory Visit: Payer: PPO | Admitting: Allergy & Immunology

## 2019-12-20 ENCOUNTER — Other Ambulatory Visit: Payer: Self-pay

## 2019-12-20 ENCOUNTER — Encounter: Payer: Self-pay | Admitting: Allergy & Immunology

## 2019-12-20 VITALS — BP 126/82 | HR 86 | Temp 97.6°F | Resp 18 | Ht 72.0 in | Wt 240.8 lb

## 2019-12-20 DIAGNOSIS — J302 Other seasonal allergic rhinitis: Secondary | ICD-10-CM

## 2019-12-20 DIAGNOSIS — J4520 Mild intermittent asthma, uncomplicated: Secondary | ICD-10-CM | POA: Diagnosis not present

## 2019-12-20 DIAGNOSIS — J310 Chronic rhinitis: Secondary | ICD-10-CM

## 2019-12-20 DIAGNOSIS — J3089 Other allergic rhinitis: Secondary | ICD-10-CM

## 2019-12-20 DIAGNOSIS — T781XXD Other adverse food reactions, not elsewhere classified, subsequent encounter: Secondary | ICD-10-CM

## 2019-12-20 NOTE — Patient Instructions (Addendum)
1. Mild intermittent asthma, uncomplicated - Lung testing actually looked quite good. - We are not going to make any medication changes at this time. - I do not want to start a controller medication or an as needed inhaled steroid since you reacted so badly to the Symbicort.  2. Chronic rhinitis - Testing today showed: grasses, trees, indoor molds, outdoor molds and cat - Copy of test results provided. - Avoidance measures provided. - Start taking: Zyrtec (cetirizine) 10mg  tablet once daily  - You can use an extra dose of the antihistamine, if needed, for breakthrough symptoms.  - Consider nasal saline rinses 1-2 times daily to remove allergens from the nasal cavities as well as help with mucous clearance (this is especially helpful to do before the nasal sprays are given) - Consider allergy shots as a means of long-term control. - Allergy shots "re-train" and "reset" the immune system to ignore environmental allergens and decrease the resulting immune response to those allergens (sneezing, itchy watery eyes, runny nose, nasal congestion, etc).    - Allergy shots improve symptoms in 75-85% of patients.  - We can discuss more at the next appointment if the medications are not working for you.  3. Return in about 6 weeks (around 01/31/2020).   Please inform us of any Emergency Department visits, hospitalizations, or changes in symptoms. Call us before going to the ED for breathing or allergy symptoms since we might be able to fit you in for a sick visit. Feel free to contact us anytime with any questions, problems, or concerns.  It was a pleasure to meet you today!  Websites that have reliable patient information: 1. American Academy of Asthma, Allergy, and Immunology: www.aaaai.org 2. Food Allergy Research and Education (FARE): foodallergy.org 3. Mothers of Asthmatics: http://www.asthmacommunitynetwork.org 4. American College of Allergy, Asthma, and Immunology: www.acaai.org   COVID-19  Vaccine Information can be found at: [email or web address] For questions related to vaccine distribution or appointments, please email [email or web address] or call [phone].     "Like" [place] on Facebook and Instagram for our latest updates!     HAPPY FALL!     Make sure you are registered to vote! If you have moved or changed any of your contact information, you will need to get this updated before voting!  In some cases, you MAY be able to register to vote online: [email or web address]      Reducing Pollen Exposure  The American Academy of Allergy, Asthma and Immunology suggests the following steps to reduce your exposure to pollen during allergy seasons.    1. Do not hang sheets or clothing out to dry; pollen may collect on these items. 2. Do not mow lawns or spend time around freshly cut grass; mowing stirs up pollen. 3. Keep windows closed at night.  Keep car windows closed while driving. 4. Minimize morning activities outdoors, a time when pollen counts are usually at their highest. 5. Stay indoors as much as possible when pollen counts or humidity is high and on windy days when pollen tends to remain in the air longer. 6. Use air conditioning when possible.  Many air conditioners have filters that trap the pollen spores. 7. Use a HEPA room air filter to remove pollen form the indoor air you breathe.  Control of Mold Allergen   Mold and fungi can grow on a variety of surfaces provided certain temperature and moisture conditions exist.  Outdoor molds grow on plants, decaying vegetation and soil.  The major outdoor mold, Alternaria and Cladosporium, are  found in very high numbers during hot and dry conditions.  Generally, a late Summer - Fall peak is seen for common outdoor fungal spores.  Rain will temporarily lower outdoor mold spore count, but counts rise rapidly when the rainy period ends.   The most important indoor molds are Aspergillus and Penicillium.  Dark, humid and poorly ventilated basements are ideal sites for mold growth.  The next most common sites of mold growth are the bathroom and the kitchen.  Outdoor (Seasonal) Mold Control  Positive outdoor molds via skin testing: Alternaria, Cladosporium, Bipolaris (Helminthsporium), Drechslera (Curvalaria) and Mucor  1. Use air conditioning and keep windows closed 2. Avoid exposure to decaying vegetation. 3. Avoid leaf raking. 4. Avoid grain handling. 5. Consider wearing a face mask if working in moldy areas.    Control of Dog or Cat Allergen  Avoidance is the best way to manage a dog or cat allergy. If you have a dog or cat and are allergic to dog or cats, consider removing the dog or cat from the home. If you have a dog or cat but don't want to find it a new home, or if your family wants a pet even though someone in the household is allergic, here are some strategies that may help keep symptoms at bay:  1. Keep the pet out of your bedroom and restrict it to only a few rooms. Be advised that keeping the dog or cat in only one room will not limit the allergens to that room. 2. Don't pet, hug or kiss the dog or cat; if you do, wash your hands with soap and water. 3. High-efficiency particulate air (HEPA) cleaners run continuously in a bedroom or living room can reduce allergen levels over time. 4. Regular use of a high-efficiency vacuum cleaner or a central vacuum can reduce allergen levels. 5. Giving your dog or cat a bath at least once a week can reduce airborne allergen.

## 2019-12-20 NOTE — Progress Notes (Signed)
NEW PATIENT  Date of Service/Encounter:  12/20/19  Referring provider: Doree Albee, MD   Assessment:   Mild intermittent asthma, uncomplicated - with a clear occupational component  Seasonal and perennial allergic rhinitis (grasses, trees, indoor molds, outdoor molds and cat)  Adverse food reaction - with negative testing to the most common foods  Disabled status  Plan/Recommendations:   1. Mild intermittent asthma, uncomplicated - Lung testing actually looked quite good. - We are not going to make any medication changes at this time. - I do not want to start a controller medication or an as needed inhaled steroid since you reacted so badly to the Symbicort.  2. Chronic rhinitis - Testing today showed: grasses, trees, indoor molds, outdoor molds and cat - Copy of test results provided. - Avoidance measures provided. - Start taking: Zyrtec (cetirizine) 10mg  tablet once daily  - You can use an extra dose of the antihistamine, if needed, for breakthrough symptoms.  - Consider nasal saline rinses 1-2 times daily to remove allergens from the nasal cavities as well as help with mucous clearance (this is especially helpful to do before the nasal sprays are given) - Consider allergy shots as a means of long-term control. - Allergy shots "re-train" and "reset" the immune system to ignore environmental allergens and decrease the resulting immune response to those allergens (sneezing, itchy watery eyes, runny nose, nasal congestion, etc).    - Allergy shots improve symptoms in 75-85% of patients.  - We can discuss more at the next appointment if the medications are not working for you.  3. Return in about 6 weeks (around 01/31/2020).   Subjective:   James Harris is a 64 y.o. male presenting today for evaluation of  Chief Complaint  Patient presents with  . Shortness of Breath    James Harris has a history of the following: Patient Active Problem List   Diagnosis  Date Noted  . Seasonal and perennial allergic rhinitis 12/21/2019  . Mild intermittent asthma, uncomplicated 74/82/7078  . Reactive airways disease 10/18/2019  . Vitamin D deficiency 05/24/2019  . Chronic obstructive pulmonary disease with acute exacerbation ([place]) 05/24/2019  . Recurrent major depressive disorder, in partial remission ([place]) 05/24/2019  . Cervical spondylosis with myelopathy and radiculopathy 11/23/2018  . KNEE, ARTHRITIS, DEGEN./OSTEO 10/24/2008  . CHONDROMALACIA PATELLA 10/24/2008  . DERANGEMENT MENISCUS 08/23/2008    History obtained from: chart review and patient.  James Harris was referred by Doree Albee, MD.     James Harris is a 64 y.o. male presenting for an evaluation of allergies and asthma.  He has a four year history of difficulty breathing. He had symptoms that worsened when he was at work. He is certain that this was related to his chemical exposure at work. He never figured out what was setting him off, but he would not be able to breathe and could not go down the steps without stopping to rest. Prednisone helped the most. He was on it daily for the last year and has weaned off of it.   He saw Dr. Melvyn Novas and apparently had methacholine challenge a couple of years ago. This was done in [facility] in [place].  He was referred here for an evaluation of possible allergies contributing to his symptoms.  He also brings up the fact that it might be related to foods.  He has no hives, stomach pain, throat tightening, or other issues with any particular food.   Asthma/Respiratory Symptom History: His  lung function never looks bad, but there are times when he cannot walk and go down the road. He does have albuterol and he uses this only as needed. The last time that he used it was 3 weeks ago.  He was a smoker until 26.  This all seem to stem around his work exposures.  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  Allergic Rhinitis Symptom History: He denies nasal  congestion, itchy watery eyes, or rhinorrhea. He does have occasional sneezing. Symptoms do seem to worsen during the high humidity or extremes of temperature. He has never undergone environmental allergy testing.  He does not take anything on a routine basis.  Otherwise, there is no history of other atopic diseases, including food allergies, drug allergies, stinging insect allergies, eczema, urticaria or contact dermatitis. There is no significant infectious history. Vaccinations are up to date.    Past Medical History: Patient Active Problem List   Diagnosis Date Noted  . Seasonal and perennial allergic rhinitis 12/21/2019  . Mild intermittent asthma, uncomplicated 21/19/4174  . Reactive airways disease 10/18/2019  . Vitamin D deficiency 05/24/2019  . Chronic obstructive pulmonary disease with acute exacerbation ([place]) 05/24/2019  . Recurrent major depressive disorder, in partial remission ([place]) 05/24/2019  . Cervical spondylosis with myelopathy and radiculopathy 11/23/2018  . KNEE, ARTHRITIS, DEGEN./OSTEO 10/24/2008  . CHONDROMALACIA PATELLA 10/24/2008  . DERANGEMENT MENISCUS 08/23/2008    Medication List:  Allergies as of 12/20/2019      Reactions   Symbicort [budesonide-formoterol Fumarate] Other (See Comments)   Allergic reaction      Medication List       Accurate as of December 20, 2019 11:59 PM. If you have any questions, ask your nurse or doctor.        albuterol 108 (90 Base) MCG/ACT inhaler Commonly known as: VENTOLIN HFA Inhale 2 puffs into the lungs every 6 (six) hours as needed for wheezing or shortness of breath.   albuterol (2.5 MG/3ML) 0.083% nebulizer solution Commonly known as: PROVENTIL Take 3 mLs (2.5 mg total) by nebulization every 6 (six) hours as needed for wheezing or shortness of breath.   cetirizine 10 MG tablet Commonly known as: ZYRTEC Take 1 tablet (10 mg total) by mouth daily. Started by: Valentina Shaggy, MD   escitalopram 10 MG  tablet Commonly known as: LEXAPRO Take 1 tablet (10 mg total) by mouth daily.   MULTIVITAMIN ADULTS 50+ PO Take by mouth.   multivitamin with minerals Tabs tablet Take 1 tablet by mouth daily.   pantoprazole 40 MG tablet Commonly known as: PROTONIX TAKE 30-60 MINUTES BEFORE YOUR FIRST MEAL AND LAST MEAL OF THE DAY   Vitamin D-3 125 MCG (5000 UT) Tabs Take 2 tablets by mouth daily.       Birth History: non-contributory  Developmental History: non-contributory  Past Surgical History: Past Surgical History:  Procedure Laterality Date  . ANTERIOR CERVICAL DECOMP/DISCECTOMY FUSION N/A 11/23/2018   Procedure: Anterior Cervical Discectomy Fusion - Cervical Five-Cervical Six - Cervical Six-Cervical Seven;  Surgeon: Earnie Larsson, MD;  Location: [place];  Service: Neurosurgery;  Laterality: N/A;  Anterior Cervical Discectomy Fusion - Cervical Five-Cervical Six - Cervical Six-Cervical Seven  . KNEE SURGERY  bilateral  . TONSILLECTOMY     x 2     Family History: Family History  Family history unknown: Yes     Social History: Levan lives at home with his family.  He lives in a house that is 64 years old.  There is hardwood  in the main living areas and carpeting in the bedrooms.  He has combination of gas heating and central cooling.  There are dogs and cats outside of the home and cats inside of the home.  There are no dust mite covers.  He is not a smoker.  He is retired, but worked in a Civil engineer, contracting for 30 years.  He is not exposed to tobacco exposure or other fumes or chemicals at this point in time.  He does not have a HEPA filter.  He does not live near an interstate or industrial area. 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 The farm is [employer]. He was doing this 2-3 times per week but they are not doing a lot on the farm with [place]. 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    Review of Systems  Constitutional: Negative.  Negative for chills, fever, malaise/fatigue and weight loss.  HENT: Negative for congestion, ear discharge, ear pain and sinus pain.   Eyes: Negative for pain, discharge and redness.  Respiratory: Positive for cough and shortness of breath. Negative for sputum production and wheezing.   Cardiovascular: Negative.  Negative for chest pain and palpitations.  Gastrointestinal: Negative for abdominal pain, constipation, diarrhea, heartburn, nausea and vomiting.  Skin: Negative.  Negative for itching and rash.  Neurological: Negative for dizziness and headaches.  Endo/Heme/Allergies: Negative for environmental allergies. Does not bruise/bleed easily.       Objective:   Blood pressure 126/82, pulse 86, temperature 97.6 F (36.4 C), temperature source Temporal, resp. rate 18, height 6' (1.829 m), weight 240 lb 12.8 oz (109.2 kg), SpO2 97 %. Body mass index is 32.66 kg/m.   Physical Exam:   Physical Exam Constitutional:      Appearance: He is well-developed. He is obese.     Comments: Talkative male. Cooperative with the exam. Bearded.   HENT:     Head: Normocephalic and atraumatic.     Right Ear: Tympanic membrane, ear canal and external ear normal. No drainage, swelling or tenderness. Tympanic membrane is not injected, scarred, erythematous, retracted or bulging.     Left Ear: Tympanic membrane, ear canal and external ear normal. No drainage, swelling or tenderness. Tympanic membrane is not injected, scarred, erythematous, retracted or bulging.     Nose: No nasal deformity, septal deviation, mucosal edema or rhinorrhea.     Right Turbinates: Enlarged and swollen.     Left Turbinates: Enlarged and swollen.     Right Sinus: No maxillary sinus tenderness or frontal sinus tenderness.     Left Sinus: No maxillary sinus tenderness or frontal sinus tenderness.     Comments: Cobblestoning present in the posterior oropharynx.      Mouth/Throat:     Mouth: Mucous membranes are not pale and not dry.     Pharynx: Uvula midline.  Eyes:     General: Allergic shiner present.        Right eye: No discharge.        Left eye: No discharge.     Conjunctiva/sclera: Conjunctivae normal.     Right eye: Right conjunctiva is not injected. No chemosis.    Left eye: Left conjunctiva is not injected. No chemosis.    Pupils: Pupils are equal, round, and reactive to light.  Cardiovascular:     Rate and Rhythm: Normal rate and regular  rhythm.     Heart sounds: Normal heart sounds.  Pulmonary:     Effort: Pulmonary effort is normal. No tachypnea, accessory muscle usage or respiratory distress.     Breath sounds: Normal breath sounds. No decreased breath sounds, wheezing, rhonchi or rales.     Comments: Moving air well in all lung fields. No wheezing or crackles noted.  Chest:     Chest wall: No tenderness.  Abdominal:     Tenderness: There is no abdominal tenderness. There is no guarding or rebound.  Lymphadenopathy:     Head:     Right side of head: No submandibular, tonsillar or occipital adenopathy.     Left side of head: No submandibular, tonsillar or occipital adenopathy.     Cervical: No cervical adenopathy.  Skin:    General: Skin is warm.     Capillary Refill: Capillary refill takes less than 2 seconds.     Coloration: Skin is not pale.     Findings: No abrasion, erythema, petechiae or rash. Rash is not papular, urticarial or vesicular.  Neurological:     Mental Status: He is alert.  Psychiatric:        Behavior: Behavior is cooperative.      Diagnostic studies:    Spirometry: results normal (FEV1: 3.19/85%, FVC: 3.99/80%, FEV1/FVC: 80%).    Spirometry consistent with normal pattern.   Allergy Studies:     Airborne Adult Perc - 12/20/19 1400    Time Antigen Placed 1404    Allergen Manufacturer Lavella Hammock    Location Back    Number of Test 59    Panel 1 Select    1. Control-Buffer 50% Glycerol Negative    2.  Control-Histamine 1 mg/ml 2+    3. Albumin saline Negative    4. Reynolds Negative    5. [place] Negative    6. Johnson Negative    7. Niagara Blue Negative    8. Meadow Fescue Negative    9. Perennial Rye Negative    10. Sweet Vernal Negative    11. Zackarey Negative    12. Cocklebur Negative    13. Burweed Marshelder Negative    14. Ragweed, short Negative    15. Ragweed, Giant Negative    16. Plantain,  English Negative    17. Lamb's Quarters Negative    18. Sheep Sorrell Negative    19. Rough Pigweed Negative    20. Marsh Elder, Rough Negative    21. Mugwort, Common Negative    22. Ash mix Negative    23. Birch mix Negative    24. Beech American Negative    25. Box, Elder Negative    26. Cedar, red Negative    27. Cottonwood, [place] Negative    28. Elm mix Negative    29. Hickory Negative    30. Maple mix Negative    31. Oak, [place] mix Negative    32. Pecan Pollen Negative    33. Pine mix Negative    34. Sycamore Eastern Negative    35. [place]field, Black Pollen Negative    36. Alternaria alternata Negative    37. Cladosporium Herbarum Negative    38. Aspergillus mix Negative    39. Penicillium mix Negative    40. Bipolaris sorokiniana (Helminthosporium) Negative    41. Drechslera spicifera (Curvularia) Negative    42. Mucor plumbeus Negative    43. Fusarium moniliforme Negative    44. Aureobasidium pullulans (pullulara) Negative    45. Rhizopus oryzae Negative  46. Botrytis cinera Negative    47. Epicoccum nigrum Negative    48. Phoma betae Negative    49. Candida Albicans Negative    50. Trichophyton mentagrophytes Negative    51. Mite, D Farinae  5,000 AU/ml Negative    52. Mite, D Pteronyssinus  5,000 AU/ml Negative    53. Cat Hair 10,000 BAU/ml Negative    54.  Dog Epithelia Negative    55. Mixed Feathers Negative    56. Horse Epithelia Negative    57. Cockroach, German Negative    58. Mouse Negative    59. Tobacco Leaf Negative          Food Perc -  12/20/19 1405      Test Information   Time Antigen Placed 1093    Allergen Manufacturer Lavella Hammock    Location Back    Number of allergen test [place]   1. Peanut Negative    2. Soybean food Negative    3. Wheat, whole Negative    4. Sesame Negative    5. Milk, cow Negative    6. Egg White, chicken Negative    7. Casein Negative    8. Shellfish mix Negative    9. Fish mix Negative    10. Cashew Negative          Intradermal - 12/20/19 1512    Time Antigen Placed 1500    Allergen Manufacturer Lavella Hammock    Location Arm    Number of Test 15    Intradermal Select    Control Negative    Guatemala 2+    Johnson 2+    7 Grass 1+    Ragweed mix Negative    Weed mix Negative    Tree mix 2+    Mold 1 2+    Mold 2 Negative    Mold 3 Negative    Mold 4 Negative    Cat 1+    Dog Negative    Cockroach Negative    Mite mix Negative    Other Omitted           Allergy testing results were read and interpreted by myself, documented by clinical staff.         Salvatore Marvel, MD [facility]

## 2019-12-21 DIAGNOSIS — J4520 Mild intermittent asthma, uncomplicated: Secondary | ICD-10-CM | POA: Insufficient documentation

## 2019-12-21 DIAGNOSIS — J3089 Other allergic rhinitis: Secondary | ICD-10-CM | POA: Insufficient documentation

## 2019-12-21 DIAGNOSIS — J302 Other seasonal allergic rhinitis: Secondary | ICD-10-CM | POA: Insufficient documentation

## 2019-12-21 MED ORDER — CETIRIZINE HCL 10 MG PO TABS: 10.0000 mg | ORAL_TABLET | Freq: Every day | ORAL | 5 refills | Status: DC

## 2020-01-15 DIAGNOSIS — J449 Chronic obstructive pulmonary disease, unspecified: Secondary | ICD-10-CM | POA: Diagnosis not present

## 2020-01-18 ENCOUNTER — Telehealth (INDEPENDENT_AMBULATORY_CARE_PROVIDER_SITE_OTHER): Payer: PPO | Admitting: Internal Medicine

## 2020-01-18 ENCOUNTER — Encounter (INDEPENDENT_AMBULATORY_CARE_PROVIDER_SITE_OTHER): Payer: Self-pay | Admitting: Internal Medicine

## 2020-01-18 ENCOUNTER — Other Ambulatory Visit: Payer: Self-pay

## 2020-01-18 VITALS — BP 160/87 | HR 82 | Temp 98.7°F | Resp 18 | Ht 72.0 in | Wt 235.0 lb

## 2020-01-18 DIAGNOSIS — Z1212 Encounter for screening for malignant neoplasm of rectum: Secondary | ICD-10-CM | POA: Diagnosis not present

## 2020-01-18 DIAGNOSIS — R5383 Other fatigue: Secondary | ICD-10-CM

## 2020-01-18 DIAGNOSIS — Z1211 Encounter for screening for malignant neoplasm of colon: Secondary | ICD-10-CM

## 2020-01-18 DIAGNOSIS — R5381 Other malaise: Secondary | ICD-10-CM

## 2020-01-18 DIAGNOSIS — R059 Cough, unspecified: Secondary | ICD-10-CM | POA: Diagnosis not present

## 2020-01-18 NOTE — Progress Notes (Signed)
Metrics: Intervention Frequency ACO  Documented Smoking Status Yearly  Screened one or more times in 24 months  Cessation Counseling or  Active cessation medication Past 24 months  Past 24 months   Guideline developer: UpToDate (See UpToDate for funding source) Date Released: 2014       Wellness Office Visit  Subjective:  Patient ID: James Harris, male    DOB: September 27, 1955  Age: 65 y.o. MRN: [id number]  CC: This is an audio telemedicine visit with the permission of the patient who is at home and I am in my office. I used 2 identifiers to identify the patient. Cough, extreme weakness, COVID-19 possible exposure. HPI  This patient started having the above symptoms yesterday and his wife has similar symptoms which started 3 days ago.  The wife had been in contact she thinks at work from someone who had COVID-19 disease.  The patient is unvaccinated. He also has respiratory disease.  He denies that he is more short of breath than usual.  However, he is feeling extremely exhausted/fatigued.  He also has a headache. Past Medical History:  Diagnosis Date  . Arthritis   . Cancer ([place])    basal cell on nose  . COPD (chronic obstructive pulmonary disease) ([place])   . Depression    in the past  . Peripheral vascular disease ([place])    blood clot in left leg  . Pneumonia    Past Surgical History:  Procedure Laterality Date  . ANTERIOR CERVICAL DECOMP/DISCECTOMY FUSION N/A 11/23/2018   Procedure: Anterior Cervical Discectomy Fusion - Cervical Five-Cervical Six - Cervical Six-Cervical Seven;  Surgeon: Earnie Larsson, MD;  Location: [facility];  Service: Neurosurgery;  Laterality: N/A;  Anterior Cervical Discectomy Fusion - Cervical Five-Cervical Six - Cervical Six-Cervical Seven  . KNEE SURGERY  bilateral  . TONSILLECTOMY     x 2     Family History  Family history unknown: Yes    Social History   Social History Narrative  . Not on file   Social History   Tobacco Use  . Smoking status:  Former Smoker    Packs/day: 1.50    Years: 20.00    Pack years: 30.00    Types: Cigarettes    Quit date: 01/14/1984    Years since quitting: 36.0  . Smokeless tobacco: Never Used  Substance Use Topics  . Alcohol use: Yes    Comment: rarely    Current Meds  Medication Sig  . albuterol (PROVENTIL) (2.5 MG/3ML) 0.083% nebulizer solution Take 3 mLs (2.5 mg total) by nebulization every 6 (six) hours as needed for wheezing or shortness of breath.  Marland Kitchen albuterol (VENTOLIN HFA) 108 (90 Base) MCG/ACT inhaler Inhale 2 puffs into the lungs every 6 (six) hours as needed for wheezing or shortness of breath.  . cetirizine (ZYRTEC) 10 MG tablet Take 1 tablet (10 mg total) by mouth daily.  . Cholecalciferol (VITAMIN D-3) 125 MCG (5000 UT) TABS Take 2 tablets by mouth daily.  Marland Kitchen escitalopram (LEXAPRO) 10 MG tablet Take 1 tablet (10 mg total) by mouth daily.  . Multiple Vitamin (MULTIVITAMIN WITH MINERALS) TABS tablet Take 1 tablet by mouth daily.  . Multiple Vitamins-Minerals (MULTIVITAMIN ADULTS 50+ PO) Take by mouth.  . pantoprazole (PROTONIX) 40 MG tablet TAKE 30-60 MINUTES BEFORE YOUR FIRST MEAL AND LAST MEAL OF THE DAY      Depression screen [facility] 2/9 12/12/2019 05/24/2019  Decreased Interest 0 0  Down, Depressed, Hopeless 0 0  PHQ - 2 Score 0  0  Altered sleeping 0 -  Tired, decreased energy 0 -  Change in appetite 0 -  Feeling bad or failure about yourself  0 -  Trouble concentrating 0 -  Moving slowly or fidgety/restless 0 -  Suicidal thoughts 0 -  PHQ-9 Score 0 -     Objective:   Today's Vitals: BP (!) 160/87 (BP Location: Left Arm, Patient Position: Sitting, Cuff Size: Normal) Comment: Not able to check  Pulse 82 Comment: Not able to check  Temp 98.7 F (37.1 C) (Oral) Comment: Not able to check  Resp 18 Comment: counted self.  Ht 6' (1.829 m)   Wt 235 lb (106.6 kg) Comment: Not able to weigh; pt given.  BMI 31.87 kg/m  Vitals with BMI 01/18/2020 12/20/2019 12/12/2019  Height 6\' 0"   6\' 0"  6\' 0"   Weight 235 lbs 240 lbs 13 oz 242 lbs  BMI 31.86 32.65 32.81  Systolic 160 126  Diastolic 87 82 60  Pulse 82 86 72     Physical Exam  Speech was normal over the phone.  He appeared to be alert and orientated.     Assessment   1. Encounter for colorectal cancer screening   2. Cough   3. Malaise and fatigue       Tests ordered No orders of the defined types were placed in this encounter.    Plan: 1. I suspect COVID-19 disease.  He has been given the number by my staff to call for COVID-19 test.  If this is positive, he will let know.  He will then need treatment depending on how sick he is either with monoclonal antibody therapy or if he is sicker, may need hospitalization.  He is aware of these recommendations. 2. This phone call lasted 5 minutes.   No orders of the defined types were placed in this encounter.   , MD

## 2020-01-19 ENCOUNTER — Telehealth (INDEPENDENT_AMBULATORY_CARE_PROVIDER_SITE_OTHER): Payer: Self-pay

## 2020-01-19 ENCOUNTER — Other Ambulatory Visit (INDEPENDENT_AMBULATORY_CARE_PROVIDER_SITE_OTHER): Payer: Self-pay | Admitting: Internal Medicine

## 2020-01-19 DIAGNOSIS — R5381 Other malaise: Secondary | ICD-10-CM

## 2020-01-19 DIAGNOSIS — R059 Cough, unspecified: Secondary | ICD-10-CM

## 2020-01-19 NOTE — Progress Notes (Signed)
covid

## 2020-01-19 NOTE — Telephone Encounter (Signed)
Patient called and left a message that he can be tested for Covid at the [facility] as long as an order was placed for the test. I advised Dr. Karilyn Cota and called patient back and let him know that the order has been placed. Patient stated he will call [facility] to know next steps and he will cancel to scheduled time on Friday for a Covid test if he is able to get this done through [facility]. Patient verbalized an understanding.

## 2020-01-20 ENCOUNTER — Other Ambulatory Visit: Payer: PPO

## 2020-01-20 ENCOUNTER — Other Ambulatory Visit: Payer: Self-pay

## 2020-01-20 DIAGNOSIS — Z20822 Contact with and (suspected) exposure to covid-19: Secondary | ICD-10-CM | POA: Diagnosis not present

## 2020-01-24 LAB — NOVEL CORONAVIRUS, NAA: SARS-CoV-2, NAA: DETECTED — AB

## 2020-01-25 ENCOUNTER — Other Ambulatory Visit (INDEPENDENT_AMBULATORY_CARE_PROVIDER_SITE_OTHER): Payer: Self-pay | Admitting: Internal Medicine

## 2020-01-25 ENCOUNTER — Telehealth (INDEPENDENT_AMBULATORY_CARE_PROVIDER_SITE_OTHER): Payer: Self-pay

## 2020-01-25 DIAGNOSIS — U071 COVID-19: Secondary | ICD-10-CM

## 2020-02-01 ENCOUNTER — Ambulatory Visit: Payer: PPO | Admitting: Allergy & Immunology

## 2020-02-06 DIAGNOSIS — L820 Inflamed seborrheic keratosis: Secondary | ICD-10-CM | POA: Diagnosis not present

## 2020-02-06 DIAGNOSIS — Z85828 Personal history of other malignant neoplasm of skin: Secondary | ICD-10-CM | POA: Diagnosis not present

## 2020-02-06 DIAGNOSIS — D485 Neoplasm of uncertain behavior of skin: Secondary | ICD-10-CM | POA: Diagnosis not present

## 2020-02-06 DIAGNOSIS — L821 Other seborrheic keratosis: Secondary | ICD-10-CM | POA: Diagnosis not present

## 2020-02-06 DIAGNOSIS — Z1211 Encounter for screening for malignant neoplasm of colon: Secondary | ICD-10-CM | POA: Diagnosis not present

## 2020-02-06 DIAGNOSIS — Z1212 Encounter for screening for malignant neoplasm of rectum: Secondary | ICD-10-CM | POA: Diagnosis not present

## 2020-02-06 DIAGNOSIS — B078 Other viral warts: Secondary | ICD-10-CM | POA: Diagnosis not present

## 2020-02-06 DIAGNOSIS — L570 Actinic keratosis: Secondary | ICD-10-CM | POA: Diagnosis not present

## 2020-02-06 LAB — COLOGUARD: Cologuard: NEGATIVE

## 2020-02-13 ENCOUNTER — Other Ambulatory Visit: Payer: Self-pay

## 2020-02-13 ENCOUNTER — Encounter (INDEPENDENT_AMBULATORY_CARE_PROVIDER_SITE_OTHER): Payer: Self-pay | Admitting: Internal Medicine

## 2020-02-13 ENCOUNTER — Ambulatory Visit (INDEPENDENT_AMBULATORY_CARE_PROVIDER_SITE_OTHER): Payer: PPO | Admitting: Internal Medicine

## 2020-02-13 VITALS — BP 136/78 | HR 79 | Temp 97.7°F | Ht 72.0 in | Wt 237.2 lb

## 2020-02-13 DIAGNOSIS — U071 COVID-19: Secondary | ICD-10-CM | POA: Diagnosis not present

## 2020-02-13 MED ORDER — ESCITALOPRAM OXALATE 10 MG PO TABS: 10.0000 mg | ORAL_TABLET | Freq: Every day | ORAL | 1 refills | Status: DC

## 2020-02-13 NOTE — Progress Notes (Signed)
Metrics: Intervention Frequency ACO  Documented Smoking Status Yearly  Screened one or more times in 24 months  Cessation Counseling or  Active cessation medication Past 24 months  Past 24 months   Guideline developer: UpToDate (See UpToDate for funding source) Date Released: 2014       Wellness Office Visit  Subjective:  Patient ID: James Harris, male    DOB: 02-09-1955  Age: 65 y.o. MRN: [id number]  CC: This man comes in for follow-up post COVID-19 disease. HPI  He has mostly recovered from COVID-19 disease which he had approximately 3 weeks ago.  Unfortunately, although I did put the referral in, no one contacted him regarding treatment of COVID-19 disease.  He was unvaccinated.  Thankfully, he has made a full recovery according to him. Past Medical History:  Diagnosis Date  . Arthritis   . Cancer ([place])    basal cell on nose  . COPD (chronic obstructive pulmonary disease) ([place])   . Depression    in the past  . Peripheral vascular disease ([place])    blood clot in left leg  . Pneumonia    Past Surgical History:  Procedure Laterality Date  . ANTERIOR CERVICAL DECOMP/DISCECTOMY FUSION N/A 11/23/2018   Procedure: Anterior Cervical Discectomy Fusion - Cervical Five-Cervical Six - Cervical Six-Cervical Seven;  Surgeon: Earnie Larsson, MD;  Location: [facility];  Service: Neurosurgery;  Laterality: N/A;  Anterior Cervical Discectomy Fusion - Cervical Five-Cervical Six - Cervical Six-Cervical Seven  . KNEE SURGERY  bilateral  . TONSILLECTOMY     x 2     Family History  Family history unknown: Yes    Social History   Social History Narrative  . Not on file   Social History   Tobacco Use  . Smoking status: Former Smoker    Packs/day: 1.50    Years: 20.00    Pack years: 30.00    Types: Cigarettes    Quit date: 01/14/1984    Years since quitting: 36.1  . Smokeless tobacco: Never Used  Substance Use Topics  . Alcohol use: Yes    Comment: rarely    Current Meds   Medication Sig  . albuterol (PROVENTIL) (2.5 MG/3ML) 0.083% nebulizer solution Take 3 mLs (2.5 mg total) by nebulization every 6 (six) hours as needed for wheezing or shortness of breath.  Marland Kitchen albuterol (VENTOLIN HFA) 108 (90 Base) MCG/ACT inhaler Inhale 2 puffs into the lungs every 6 (six) hours as needed for wheezing or shortness of breath.  . cetirizine (ZYRTEC) 10 MG tablet Take 1 tablet (10 mg total) by mouth daily.  . Cholecalciferol (VITAMIN D-3) 125 MCG (5000 UT) TABS Take 2 tablets by mouth daily.  . Multiple Vitamin (MULTIVITAMIN WITH MINERALS) TABS tablet Take 1 tablet by mouth daily.  . Multiple Vitamins-Minerals (MULTIVITAMIN ADULTS 50+ PO) Take by mouth.  . pantoprazole (PROTONIX) 40 MG tablet TAKE 30-60 MINUTES BEFORE YOUR FIRST MEAL AND LAST MEAL OF THE DAY  . predniSONE (DELTASONE) 20 MG tablet Take 20 mg by mouth daily.  . [DISCONTINUED] escitalopram (LEXAPRO) 10 MG tablet Take 1 tablet (10 mg total) by mouth daily.      Depression screen [facility] 02/13/2020 12/12/2019 05/24/2019  Decreased Interest 1 0 0  Down, Depressed, Hopeless 2 0 0  PHQ - 2 Score 3 0 0  Altered sleeping 1 0 -  Tired, decreased energy 1 0 -  Change in appetite 1 0 -  Feeling bad or failure about yourself  0 0 -  Trouble concentrating 1 0 -  Moving slowly or fidgety/restless 0 0 -  Suicidal thoughts 0 0 -  PHQ-9 Score 7 0 -  Difficult doing work/chores Somewhat difficult - -     Objective:   Today's Vitals: BP 136/78   Pulse 79   Temp 97.7 F (36.5 C) (Temporal)   Ht 6' (1.829 m)   Wt 237 lb 3.2 oz (107.6 kg)   SpO2 97%   BMI 32.17 kg/m  Vitals with BMI 02/13/2020 01/18/2020 12/20/2019  Height 6\' 0"  6\' 0"  6\' 0"   Weight 237 lbs 3 oz 235 lbs 240 lbs 13 oz  BMI 32.16 20.25 42.70  Systolic 623 762 831  Diastolic 78 87 82  Pulse 79 82 86     Physical Exam  He looks systemically well.  Saturation on room air is 97%.  Lung fields are clear with no wheezing, crackles or bronchial  breathing     Assessment   1. COVID-19       Tests ordered No orders of the defined types were placed in this encounter.    Plan: 1. No further recommendations, except that I would recommend he get COVID-19 vaccine which she can get in about a week to 2 weeks time. 2. I have refilled his Lexapro as requested. 3. I will schedule an appointment for him to see Judson Roch for an annual Medicare wellness visit in about 3 months time   Meds ordered this encounter  Medications  . escitalopram (LEXAPRO) 10 MG tablet    Sig: Take 1 tablet (10 mg total) by mouth daily.    Dispense:  90 tablet    Refill:  1    Kamry Faraci Luther Parody, MD

## 2020-02-15 DIAGNOSIS — J449 Chronic obstructive pulmonary disease, unspecified: Secondary | ICD-10-CM | POA: Diagnosis not present

## 2020-02-16 ENCOUNTER — Other Ambulatory Visit (INDEPENDENT_AMBULATORY_CARE_PROVIDER_SITE_OTHER): Payer: Self-pay | Admitting: Internal Medicine

## 2020-03-06 ENCOUNTER — Encounter (INDEPENDENT_AMBULATORY_CARE_PROVIDER_SITE_OTHER): Payer: Self-pay | Admitting: Internal Medicine

## 2020-03-13 NOTE — Telephone Encounter (Signed)
Waiting of fax to come over.

## 2020-03-14 DIAGNOSIS — J449 Chronic obstructive pulmonary disease, unspecified: Secondary | ICD-10-CM | POA: Diagnosis not present

## 2020-05-10 ENCOUNTER — Other Ambulatory Visit: Payer: Self-pay

## 2020-05-10 ENCOUNTER — Encounter (INDEPENDENT_AMBULATORY_CARE_PROVIDER_SITE_OTHER): Payer: Self-pay | Admitting: Nurse Practitioner

## 2020-05-10 ENCOUNTER — Telehealth (INDEPENDENT_AMBULATORY_CARE_PROVIDER_SITE_OTHER): Payer: Self-pay | Admitting: Nurse Practitioner

## 2020-05-10 ENCOUNTER — Ambulatory Visit (INDEPENDENT_AMBULATORY_CARE_PROVIDER_SITE_OTHER): Payer: PPO | Admitting: Nurse Practitioner

## 2020-05-10 VITALS — BP 132/84 | HR 88 | Temp 97.5°F | Ht 70.5 in | Wt 232.6 lb

## 2020-05-10 DIAGNOSIS — Z0000 Encounter for general adult medical examination without abnormal findings: Secondary | ICD-10-CM | POA: Diagnosis not present

## 2020-05-10 DIAGNOSIS — F32A Depression, unspecified: Secondary | ICD-10-CM | POA: Diagnosis not present

## 2020-05-10 MED ORDER — ESCITALOPRAM OXALATE 10 MG PO TABS: 10.0000 mg | ORAL_TABLET | Freq: Every day | ORAL | 1 refills | Status: AC

## 2020-05-10 NOTE — Patient Instructions (Signed)
  James Harris , Thank you for taking time to come for your Medicare Wellness Visit. I appreciate your ongoing commitment to your health goals. Please review the following plan we discussed and let me know if I can assist you in the future.   These are the goals we discussed: Goals   None     This is a list of the screening recommended for you and due dates:  Health Maintenance  Topic Date Due  . HIV Screening  Never done  . COVID-19 Vaccine (1) 05/26/2020*  . Flu Shot  08/13/2020  . Cologuard (Stool DNA test)  02/06/2023  . Tetanus Vaccine  10/06/2023  .  Hepatitis C: One time screening is recommended by Center for Disease Control  (CDC) for  adults born from [birth year] through 1965.   Completed  . HPV Vaccine  Aged Out  *Topic was postponed. The date shown is not the original due date.

## 2020-05-10 NOTE — Telephone Encounter (Signed)
Look into scheduling patient's ultrasound that was ordered in November when you have a moment please.

## 2020-05-10 NOTE — Progress Notes (Signed)
Subjective:   James Harris is a 65 y.o. male who presents for Medicare Annual/Subsequent preventive examination.  Review of Systems     Cardiac Risk Factors include: advanced age (>36men, >57 women);male gender;obesity (BMI >30kg/m2)     Objective:    Today's Vitals   05/10/20 1013  BP: 132/84  Pulse: 88  Temp: (!) 97.5 F (36.4 C)  TempSrc: Temporal  SpO2: 98%  Weight: 232 lb 9.6 oz (105.5 kg)  Height: 5' 10.5" (1.791 m)   Body mass index is 32.9 kg/m.  Advanced Directives 05/10/2020 11/23/2018 05/28/2018 12/08/2017 10/05/2013  Does Patient Have a Medical Advance Directive? No;Yes No No Yes No  Type of Advance Directive [place]  Does patient want to make changes to medical advance directive? No - Patient declined - - - -  Would patient like information on creating a medical advance directive? - - No - Patient declined - -    Current Medications (verified) Outpatient Encounter Medications as of 05/10/2020  Medication Sig  . albuterol (PROVENTIL) (2.5 MG/3ML) 0.083% nebulizer solution Take 3 mLs (2.5 mg total) by nebulization every 6 (six) hours as needed for wheezing or shortness of breath.  Marland Kitchen albuterol (VENTOLIN HFA) 108 (90 Base) MCG/ACT inhaler Inhale 2 puffs into the lungs every 6 (six) hours as needed for wheezing or shortness of breath.  . cetirizine (ZYRTEC) 10 MG tablet Take 1 tablet (10 mg total) by mouth daily.  . Cholecalciferol (VITAMIN D-3) 125 MCG (5000 UT) TABS Take 2 tablets by mouth daily.  Marland Kitchen escitalopram (LEXAPRO) 10 MG tablet TAKE 1 TABLET(10 MG) BY MOUTH DAILY  . Multiple Vitamin (MULTIVITAMIN WITH MINERALS) TABS tablet Take 1 tablet by mouth daily.  . Multiple Vitamins-Minerals (MULTIVITAMIN ADULTS 50+ PO) Take by mouth.  . pantoprazole (PROTONIX) 40 MG tablet TAKE 30-60 MINUTES BEFORE YOUR FIRST MEAL AND LAST MEAL OF THE DAY  . predniSONE (DELTASONE) 20 MG tablet Take 20 mg by mouth daily. (Patient not taking: Reported  on 05/10/2020)   No facility-administered encounter medications on file as of 05/10/2020.    Allergies (verified) Symbicort [budesonide-formoterol fumarate]   History: Past Medical History:  Diagnosis Date  . Arthritis   . Cancer ([place])    basal cell on nose  . COPD (chronic obstructive pulmonary disease) ([place])   . Depression    in the past  . Peripheral vascular disease ([place])    blood clot in left leg  . Pneumonia    Past Surgical History:  Procedure Laterality Date  . ANTERIOR CERVICAL DECOMP/DISCECTOMY FUSION N/A 11/23/2018   Procedure: Anterior Cervical Discectomy Fusion - Cervical Five-Cervical Six - Cervical Six-Cervical Seven;  Surgeon: Earnie Larsson, MD;  Location: [facility];  Service: Neurosurgery;  Laterality: N/A;  Anterior Cervical Discectomy Fusion - Cervical Five-Cervical Six - Cervical Six-Cervical Seven  . KNEE SURGERY  bilateral  . TONSILLECTOMY     x 2   Family History  Family history unknown: Yes   Social History   Socioeconomic History  . Marital status: Married    Spouse name: Not on file  . Number of children: Not on file  . Years of education: Not on file  . Highest education level: Not on file  Occupational History  . Not on file  Tobacco Use  . Smoking status: Former Smoker    Packs/day: 1.50    Years: 20.00    Pack years: 30.00    Types: Cigarettes    Quit  date: 01/14/1984    Years since quitting: 36.3  . Smokeless tobacco: Never Used  Vaping Use  . Vaping Use: Never used  Substance and Sexual Activity  . Alcohol use: Yes    Comment: rarely  . Drug use: No  . Sexual activity: Not on file  Other Topics Concern  . Not on file  Social History Narrative  . Not on file   Social Determinants of Health   Financial Resource Strain: Not on file  Food Insecurity: Not on file  Transportation Needs: Not on file  Physical Activity: Not on file  Stress: Not on file  Social Connections: Not on file    Tobacco Counseling Counseling given:  Yes   Clinical Intake:  Pre-visit preparation completed: Yes  Pain : No/denies pain     BMI - recorded: 32.9 Nutritional Status: BMI > 30  Obese Nutritional Risks: None Diabetes: No  How often do you need to have someone help you when you read instructions, pamphlets, or other written materials from your doctor or pharmacy?: 1 - Never What is the last grade level you completed in school?: 12th grade  Diabetic? No  Interpreter Needed?: No  Information entered by :: Jeralyn Ruths, NP-C   Activities of Daily Living In your present state of health, do you have any difficulty performing the following activities: 05/10/2020  Hearing? Y  Vision? Y  Difficulty concentrating or making decisions? Y  Comment Short-term memory  Walking or climbing stairs? N  Dressing or bathing? N  Doing errands, shopping? N  Preparing Food and eating ? N  Using the Toilet? N  In the past six months, have you accidently leaked urine? N  Do you have problems with loss of bowel control? N  Managing your Medications? N  Managing your Finances? N  Housekeeping or managing your Housekeeping? N  Some recent data might be hidden    Patient Care Team: Doree Albee, MD as PCP - General (Internal Medicine)  Indicate any recent Medical Services you may have received from other than [facility] providers in the past year (date may be approximate).     Assessment:   This is a routine wellness examination for T[place].  Hearing/Vision screen No exam data present  Dietary issues and exercise activities discussed: Current Exercise Habits: The patient does not participate in regular exercise at present [facility] dog), Exercise limited by: respiratory conditions(s)  Goals   None    Depression Screen PHQ 2/9 Scores 05/10/2020 02/13/2020 12/12/2019 05/24/2019  PHQ - 2 Score 0 3 0 0  PHQ- 9 Score 0 7 0 -  Exception Documentation - - - Medical reason    Fall Risk Fall Risk  05/10/2020 05/24/2019  Falls in the  past year? 1 0  Number falls in past yr: 0 0  Injury with Fall? 0 0  Risk for fall due to : History of fall(s) No Fall Risks  Follow up Falls evaluation completed;Education provided;Falls prevention discussed Falls evaluation completed    FALL RISK PREVENTION PERTAINING TO THE HOME:  Any stairs in or around the home? Yes  If so, are there any without handrails? No  Home free of loose throw rugs in walkways, pet beds, electrical cords, etc? Yes  - does have multiple cats Adequate lighting in your home to reduce risk of falls? Yes   ASSISTIVE DEVICES UTILIZED TO PREVENT FALLS:  Life alert? No  Use of a cane, walker or w/c? Yes  - Cane and walker Grab bars  in the bathroom? No  Shower chair or bench in shower? Yes  Elevated toilet seat or a handicapped toilet? Yes   TIMED UP AND GO:  Was the test performed? Yes .  Length of time to ambulate 10 feet: 7 sec.   Gait steady and fast without use of assistive device  Cognitive Function:     6CIT Screen 05/10/2020  What Year? 0 points  What month? 0 points  What time? 0 points  Count back from 20 0 points  Months in reverse 0 points  Repeat phrase 0 points  Total Score 0    Immunizations Immunization History  Administered Date(s) Administered  . Influenza,inj,Quad PF,6+ Mos 11/17/2019  . Pneumococcal Polysaccharide-23 05/24/2019  . Tdap 10/05/2013    TDAP status: Up to date  Flu Vaccine status: Up to date  Pneumococcal vaccine status: Up to date  Covid-19 vaccine status: Declined, Education has been provided regarding the importance of this vaccine but patient still declined. Advised may receive this vaccine at local pharmacy or Health Dept.or vaccine clinic. Aware to provide a copy of the vaccination record if obtained from local pharmacy or Health Dept. Verbalized acceptance and understanding.  Qualifies for Shingles Vaccine? No   Zostavax completed No   Shingrix Completed?: No.    Education has been provided  regarding the importance of this vaccine. Patient has been advised to call insurance company to determine out of pocket expense if they have not yet received this vaccine. Advised may also receive vaccine at local pharmacy or Health Dept. Verbalized acceptance and understanding.  Screening Tests Health Maintenance  Topic Date Due  . Hepatitis C Screening  Never done  . COVID-19 Vaccine (1) Never done  . HIV Screening  Never done  . COLONOSCOPY (Pts 45-55yrs Insurance coverage will need to be confirmed)  Never done  . INFLUENZA VACCINE  08/13/2020  . TETANUS/TDAP  10/06/2023  . HPV VACCINES  Aged Out    Health Maintenance  Health Maintenance Due  Topic Date Due  . Hepatitis C Screening  Never done  . COVID-19 Vaccine (1) Never done  . HIV Screening  Never done  . COLONOSCOPY (Pts 45-29yrs Insurance coverage will need to be confirmed)  Never done    Colorectal cancer screening: Type of screening: Cologuard. Completed 01/2020. Repeat every 3 years  Lung Cancer Screening: (Low Dose CT Chest recommended if Age 42-80 years, 30 pack-year currently smoking OR have quit w/in 15years.) does not qualify.   Lung Cancer Screening Referral: N/A  Additional Screening:  Hepatitis C Screening: does not qualify; Completed 2015-approximate date  Vision Screening: Recommended annual ophthalmology exams for early detection of glaucoma and other disorders of the eye. Is the patient up to date with their annual eye exam?  No  Who is the provider or what is the name of the office in which the patient attends annual eye exams? N/A If pt is not established with a provider, would they like to be referred to a provider to establish care? No .   Dental Screening: Recommended annual dental exams for proper oral hygiene  Community Resource Referral / Chronic Care Management: CRR required this visit?  No   CCM required this visit?  No      Plan:   Patient was encouraged to schedule eye exam as he  tells me he has not had an eye exam within the last 8 years or so.  Otherwise he was encouraged to follow-up as scheduled.  I have personally  reviewed and noted the following in the patient's chart:   . Medical and social history . Use of alcohol, tobacco or illicit drugs  . Current medications and supplements . Functional ability and status . Nutritional status . Physical activity . Advanced directives . List of other physicians . Hospitalizations, surgeries, and ER visits in previous 12 months . Vitals . Screenings to include cognitive, depression, and falls . Referrals and appointments  In addition, I have reviewed and discussed with patient certain preventive protocols, quality metrics, and best practice recommendations. A written personalized care plan for preventive services as well as general preventive health recommendations were provided to patient.    Patient will follow up in approximate 3 months or sooner as needed.  Ailene Ards, NP   05/10/2020

## 2020-05-14 NOTE — Telephone Encounter (Signed)
Got approved; can go . Setting appt. Pt will see on mychart soon.

## 2020-05-15 NOTE — Telephone Encounter (Signed)
Just called pt he did see the app message pop up on phone. He added to his calender and will be there. Pt said to tell you "Thank you"

## 2020-05-15 NOTE — Telephone Encounter (Signed)
Okay good! Thank you for letting me know

## 2020-05-15 NOTE — Telephone Encounter (Signed)
Once it is scheduled will you also either call the patient or send him a letter about scheduled time, just in case he doesn't check his mychart. Thank you.

## 2020-05-18 ENCOUNTER — Other Ambulatory Visit: Payer: Self-pay | Admitting: Internal Medicine

## 2020-05-22 ENCOUNTER — Ambulatory Visit (HOSPITAL_COMMUNITY)
Admission: RE | Admit: 2020-05-22 | Discharge: 2020-05-22 | Disposition: A | Discharge status: Home / Self Care | Payer: PPO | Source: Ambulatory Visit | Attending: Internal Medicine | Admitting: Internal Medicine

## 2020-05-22 ENCOUNTER — Other Ambulatory Visit: Payer: Self-pay

## 2020-05-22 DIAGNOSIS — R1011 Right upper quadrant pain: Secondary | ICD-10-CM | POA: Insufficient documentation

## 2020-05-22 DIAGNOSIS — K7689 Other specified diseases of liver: Secondary | ICD-10-CM | POA: Diagnosis not present

## 2020-05-22 DIAGNOSIS — K760 Fatty (change of) liver, not elsewhere classified: Secondary | ICD-10-CM | POA: Diagnosis not present

## 2020-06-20 ENCOUNTER — Encounter (INDEPENDENT_AMBULATORY_CARE_PROVIDER_SITE_OTHER): Payer: Self-pay | Admitting: Internal Medicine

## 2020-08-27 ENCOUNTER — Ambulatory Visit (INDEPENDENT_AMBULATORY_CARE_PROVIDER_SITE_OTHER): Payer: PPO | Admitting: Internal Medicine

## 2020-11-03 ENCOUNTER — Other Ambulatory Visit (INDEPENDENT_AMBULATORY_CARE_PROVIDER_SITE_OTHER): Payer: Self-pay | Admitting: Nurse Practitioner

## 2020-11-03 DIAGNOSIS — F32A Depression, unspecified: Secondary | ICD-10-CM

## 2021-02-07 ENCOUNTER — Other Ambulatory Visit: Payer: Self-pay | Admitting: Internal Medicine

## 2021-04-23 ENCOUNTER — Other Ambulatory Visit (HOSPITAL_COMMUNITY): Payer: Self-pay | Admitting: Nurse Practitioner

## 2021-04-23 ENCOUNTER — Ambulatory Visit (HOSPITAL_COMMUNITY)
Admission: RE | Admit: 2021-04-23 | Discharge: 2021-04-23 | Disposition: A | Discharge status: Home / Self Care | Payer: PPO | Source: Ambulatory Visit | Attending: Nurse Practitioner | Admitting: Nurse Practitioner

## 2021-04-23 DIAGNOSIS — R0781 Pleurodynia: Secondary | ICD-10-CM | POA: Insufficient documentation

## 2022-01-22 ENCOUNTER — Emergency Department (HOSPITAL_COMMUNITY)
Admission: EM | Admit: 2022-01-22 | Discharge: 2022-01-22 | Disposition: A | Discharge status: Home / Self Care | Payer: PPO | Attending: Emergency Medicine | Admitting: Emergency Medicine

## 2022-01-22 ENCOUNTER — Other Ambulatory Visit: Payer: Self-pay

## 2022-01-22 ENCOUNTER — Encounter (HOSPITAL_COMMUNITY): Payer: Self-pay

## 2022-01-22 ENCOUNTER — Emergency Department (HOSPITAL_COMMUNITY): Payer: PPO

## 2022-01-22 DIAGNOSIS — Z1152 Encounter for screening for COVID-19: Secondary | ICD-10-CM | POA: Diagnosis not present

## 2022-01-22 DIAGNOSIS — I951 Orthostatic hypotension: Secondary | ICD-10-CM | POA: Insufficient documentation

## 2022-01-22 DIAGNOSIS — J101 Influenza due to other identified influenza virus with other respiratory manifestations: Secondary | ICD-10-CM

## 2022-01-22 DIAGNOSIS — E860 Dehydration: Secondary | ICD-10-CM | POA: Diagnosis not present

## 2022-01-22 DIAGNOSIS — R55 Syncope and collapse: Secondary | ICD-10-CM | POA: Diagnosis present

## 2022-01-22 DIAGNOSIS — J449 Chronic obstructive pulmonary disease, unspecified: Secondary | ICD-10-CM | POA: Insufficient documentation

## 2022-01-22 DIAGNOSIS — Z85828 Personal history of other malignant neoplasm of skin: Secondary | ICD-10-CM | POA: Diagnosis not present

## 2022-01-22 LAB — COMPREHENSIVE METABOLIC PANEL
ALT: 19 U/L (ref 0–44)
AST: 25 U/L (ref 15–41)
Albumin: 3.7 g/dL (ref 3.5–5.0)
Alkaline Phosphatase: 80 U/L (ref 38–126)
Anion gap: 9 (ref 5–15)
BUN: 16 mg/dL (ref 8–23)
CO2: 25 mmol/L (ref 22–32)
Calcium: 8.6 mg/dL — ABNORMAL LOW (ref 8.9–10.3)
Chloride: 101 mmol/L (ref 98–111)
Creatinine, Ser: 1.38 mg/dL — ABNORMAL HIGH (ref 0.61–1.24)
GFR, Estimated: 56 mL/min — ABNORMAL LOW (ref >60)
Glucose, Bld: 121 mg/dL — ABNORMAL HIGH (ref 70–99)
Potassium: 4.5 mmol/L (ref 3.5–5.1)
Sodium: 135 mmol/L (ref 135–145)
Total Bilirubin: 0.6 mg/dL (ref 0.3–1.2)
Total Protein: 6.5 g/dL (ref 6.5–8.1)

## 2022-01-22 LAB — CBC WITH DIFFERENTIAL/PLATELET
Abs Immature Granulocytes: 0.01 10*3/uL (ref 0.00–0.07)
Basophils Absolute: 0 10*3/uL (ref 0.0–0.1)
Basophils Relative: 0 %
Eosinophils Absolute: 0 10*3/uL (ref 0.0–0.5)
Eosinophils Relative: 0 %
HCT: 48.1 % (ref 39.0–52.0)
Hemoglobin: 16.2 g/dL (ref 13.0–17.0)
Immature Granulocytes: 0 %
Lymphocytes Relative: 16 %
Lymphs Abs: 0.9 10*3/uL (ref 0.7–4.0)
MCH: 31.3 pg (ref 26.0–34.0)
MCHC: 33.7 g/dL (ref 30.0–36.0)
MCV: 92.9 fL (ref 80.0–100.0)
Monocytes Absolute: 0.6 10*3/uL (ref 0.1–1.0)
Monocytes Relative: 12 %
Neutro Abs: 3.7 10*3/uL (ref 1.7–7.7)
Neutrophils Relative %: 72 %
Platelets: 137 10*3/uL — ABNORMAL LOW (ref 150–400)
RBC: 5.18 MIL/uL (ref 4.22–5.81)
RDW: 12.8 % (ref 11.5–15.5)
WBC: 5.2 10*3/uL (ref 4.0–10.5)
nRBC: 0 % (ref 0.0–0.2)

## 2022-01-22 LAB — TROPONIN I (HIGH SENSITIVITY)
Troponin I (High Sensitivity): 11 ng/L (ref <18)
Troponin I (High Sensitivity): 11 ng/L (ref <18)

## 2022-01-22 LAB — RESP PANEL BY RT-PCR (RSV, FLU A&B, COVID)  RVPGX2
Influenza A by PCR: POSITIVE — AB
Influenza B by PCR: NEGATIVE
Resp Syncytial Virus by PCR: NEGATIVE
SARS Coronavirus 2 by RT PCR: NEGATIVE

## 2022-01-22 MED ORDER — SODIUM CHLORIDE 0.9 % IV BOLUS
1000.0000 mL | Freq: Once | INTRAVENOUS | Status: AC
  Administered 2022-01-22: 1000 mL via INTRAVENOUS

## 2022-01-22 MED ORDER — SODIUM CHLORIDE 0.9 % IV SOLN
INTRAVENOUS | Status: DC
  Administered 2022-01-22: 125 mL/h via INTRAVENOUS

## 2022-01-22 NOTE — ED Triage Notes (Signed)
Pt brought to ED via [facility] for syncopal episode, Pt had LOC, EMS arrived and pt was diaphoretic, hypotensive, pt had second syncopal episode lasting approximately 1 minute. 12 lead sinus brady. Pt report cold symptoms since last Thursday. CBG 150. NS 300 ml given PTA. LAST BP 121/76

## 2022-01-22 NOTE — ED Notes (Addendum)
Pt ambulated around the nursing station, pulse ox with ambulation stayed at 97%. Heart rate 83.

## 2022-01-22 NOTE — ED Provider Notes (Signed)
[facility] EMERGENCY DEPARTMENT Provider Note   CSN: [id number] Arrival date & time: 01/22/22  1041     History  Chief Complaint  Patient presents with   Loss of Consciousness    James Harris is a 67 y.o. male.  Pt is a 67 yo male with a pmhx significant for copd, pvd, arthritis, depression, and skin cancer.  Pt has had uri sx since 1/4.  He was at work today and felt bad, so he sat down.  Pt said he awoke to someone trying to get him to wake up.  EMS was called.  When they arrived, he was diaphoretic and hypotensive (?bp).  He had a 2nd syncopal event with EMS and was given 300 cc NS en route.  Pt is feeling better now with the fluids.       Home Medications Prior to Admission medications   Medication Sig Start Date End Date Taking? Authorizing Provider  Cholecalciferol (VITAMIN D-3) 125 MCG (5000 UT) TABS Take 2 tablets by mouth daily.   Yes [provider]  diphenhydrAMINE (BENADRYL) 25 mg capsule Take 25 mg by mouth every 6 (six) hours as needed for allergies.   Yes [provider]  escitalopram (LEXAPRO) 10 MG tablet Take 1 tablet (10 mg total) by mouth daily. 05/10/20  Yes Ailene Ards, NP  meclizine (ANTIVERT) 25 MG tablet Take 25 mg by mouth 3 (three) times daily as needed for dizziness.   Yes [provider]  Multiple Vitamin (MULTIVITAMIN WITH MINERALS) TABS tablet Take 1 tablet by mouth daily.   Yes [provider]  pantoprazole (PROTONIX) 40 MG tablet TAKE 30-60 MINUTES BEFORE YOUR FIRST MEAL AND LAST MEAL OF THE DAY 05/18/20  Yes Tanda Rockers, MD  Pseudoephedrine-APAP-DM (DAYQUIL PO) Take by mouth daily as needed (cold).   Yes [provider]  albuterol (PROVENTIL) (2.5 MG/3ML) 0.083% nebulizer solution Take 3 mLs (2.5 mg total) by nebulization every 6 (six) hours as needed for wheezing or shortness of breath. Patient not taking: Reported on 01/22/2022 05/24/19   Ailene Ards, NP  albuterol (VENTOLIN HFA) 108 (90 Base)  MCG/ACT inhaler Inhale 2 puffs into the lungs every 6 (six) hours as needed for wheezing or shortness of breath. Patient not taking: Reported on 01/22/2022    [provider]  cetirizine (ZYRTEC) 10 MG tablet Take 1 tablet (10 mg total) by mouth daily. Patient not taking: Reported on 01/22/2022 12/21/19   Valentina Shaggy, MD  predniSONE (DELTASONE) 20 MG tablet Take 20 mg by mouth daily. 01/22/20   [provider]      Allergies    Symbicort [budesonide-formoterol fumarate]    Review of Systems   Review of Systems  HENT:  Positive for congestion.   Respiratory:  Positive for cough.   Neurological:  Positive for syncope.  All other systems reviewed and are negative.   Physical Exam Updated Vital Signs BP 131/80   Pulse 70   Temp 98.4 F (36.9 C) (Oral)   Resp (!) 24   Ht 6' (1.829 m)   Wt 99.8 kg   SpO2 95%   BMI 29.84 kg/m  Physical Exam Vitals and nursing note reviewed.  Constitutional:      Appearance: Normal appearance.  HENT:     Head: Normocephalic and atraumatic.     Right Ear: External ear normal.     Left Ear: External ear normal.     Nose: Congestion present.     Mouth/Throat:  Mouth: Mucous membranes are dry.  Eyes:     Extraocular Movements: Extraocular movements intact.     Conjunctiva/sclera: Conjunctivae normal.     Pupils: Pupils are equal, round, and reactive to light.  Cardiovascular:     Rate and Rhythm: Normal rate and regular rhythm.     Pulses: Normal pulses.     Heart sounds: Normal heart sounds.  Pulmonary:     Effort: Pulmonary effort is normal.     Breath sounds: Normal breath sounds.  Abdominal:     General: Abdomen is flat. Bowel sounds are normal.     Palpations: Abdomen is soft.  Musculoskeletal:        General: Normal range of motion.     Cervical back: Normal range of motion and neck supple.  Skin:    General: Skin is warm.     Capillary Refill: Capillary refill takes less than 2 seconds.  Neurological:      General: No focal deficit present.     Mental Status: He is alert and oriented to person, place, and time.  Psychiatric:        Mood and Affect: Mood normal.        Behavior: Behavior normal.     ED Results / Procedures / Treatments   Labs (all labs ordered are listed, but only abnormal results are displayed) Labs Reviewed  RESP PANEL BY RT-PCR (RSV, FLU A&B, COVID)  RVPGX2 - Abnormal; Notable for the following components:      Result Value   Influenza A by PCR POSITIVE (*)    All other components within normal limits  CBC WITH DIFFERENTIAL/PLATELET - Abnormal; Notable for the following components:   Platelets 137 (*)    All other components within normal limits  COMPREHENSIVE METABOLIC PANEL - Abnormal; Notable for the following components:   Glucose, Bld 121 (*)    Creatinine, Ser 1.38 (*)    Calcium 8.6 (*)    GFR, Estimated 56 (*)    All other components within normal limits  URINALYSIS, ROUTINE W REFLEX MICROSCOPIC  CBG MONITORING, ED  TROPONIN I (HIGH SENSITIVITY)  TROPONIN I (HIGH SENSITIVITY)    EKG EKG Interpretation  Date/Time:  Wednesday January 22 2022 11:08:01 EST Ventricular Rate:  72 PR Interval:  164 QRS Duration: 100 QT Interval:  402 QTC Calculation: 440 R Axis:   -2 Text Interpretation: Sinus rhythm Consider RVH or posterior infarct No significant change since last tracing Confirmed by Isla Pence [phone]) on 01/22/2022 11:19:37 AM  Radiology DG Chest Port 1 View  Result Date: 01/22/2022 CLINICAL DATA:  Shortness of breath EXAM: PORTABLE CHEST 1 VIEW COMPARISON:  04/23/2021 and older x-ray series FINDINGS: Fixation hardware noted along the lower cervical spine. Normal cardiopericardial silhouette with some tortuous and ectatic aorta. No pneumothorax, effusion or edema. Subtle opacity along the lung bases bilaterally. Atelectasis is favored over infiltrate recommend follow-up. IMPRESSION: Subtle bandlike opacity at the lung bases. Atelectasis is  favored over infiltrate. Simple follow-up Electronically Signed   By: Jill Side M.D.   On: 01/22/2022 11:26    Procedures Procedures    Medications Ordered in ED Medications  sodium chloride 0.9 % bolus 1,000 mL (1,000 mLs Intravenous New Bag/Given 01/22/22 1145)    And  0.9 %  sodium chloride infusion (125 mL/hr Intravenous New Bag/Given 01/22/22 1245)    ED Course/ Medical Decision Making/ A&P  Medical Decision Making Amount and/or Complexity of Data Reviewed Labs: ordered. Radiology: ordered. ECG/medicine tests: ordered.  Risk Prescription drug management.   This patient presents to the ED for concern of syncope, this involves an extensive number of treatment options, and is a complaint that carries with it a high risk of complications and morbidity.  The differential diagnosis includes cardiogenic, vasogenic, orthostatic, infection   Co morbidities that complicate the patient evaluation  copd, pvd, arthritis, depression, and skin cancer   Additional history obtained:  Additional history obtained from epic chart review External records from outside source obtained and reviewed including EMS report   Lab Tests:  I Ordered, and personally interpreted labs.  The pertinent results include:  cbc nl, cmp nl other than cr 1.38 (1.16 in 2021); Flu A +; covid/rsv neg   Imaging Studies ordered:  I ordered imaging studies including cxr  I independently visualized and interpreted imaging which showed  Subtle bandlike opacity at the lung bases. Atelectasis is favored  over infiltrate. Simple follow-up   I agree with the radiologist interpretation   Cardiac Monitoring:  The patient was maintained on a cardiac monitor.  I personally viewed and interpreted the cardiac monitored which showed an underlying rhythm of: nsr   Medicines ordered and prescription drug management:  I ordered medication including ivfs  for dehydration  Reevaluation of  the patient after these medicines showed that the patient improved I have reviewed the patients home medicines and have made adjustments as needed  Critical Interventions:  IVFs   Problem List / ED Course:  Influenza A:  pt is saturating well.  Pt is out of the window for tamiflu. Syncope:  likely due to orthostasis from dehydration from having the flu.  Pt feels much better after fluids.  He is able to ambulate without any problems.  He is stable for d/c.  Return if worse.    Reevaluation:  After the interventions noted above, I reevaluated the patient and found that they have :improved   Social Determinants of Health:  Lives at home   Dispostion:  After consideration of the diagnostic results and the patients response to treatment, I feel that the patent would benefit from discharge with outpatient f/u.          Final Clinical Impression(s) / ED Diagnoses Final diagnoses:  Influenza A  Dehydration  Orthostatic hypotension    Rx / DC Orders ED Discharge Orders     None         Isla Pence, MD 01/22/22 1511

## 2023-01-31 ENCOUNTER — Other Ambulatory Visit: Payer: Self-pay

## 2023-01-31 ENCOUNTER — Encounter (HOSPITAL_COMMUNITY): Payer: Self-pay | Admitting: *Deleted

## 2023-01-31 ENCOUNTER — Inpatient Hospital Stay (HOSPITAL_COMMUNITY)
Admission: EM | Admit: 2023-01-31 | Discharge: 2023-02-03 | DRG: 322 | Disposition: A | Discharge status: Home / Self Care | Payer: PPO | Source: Other Acute Inpatient Hospital | Attending: Internal Medicine | Admitting: Internal Medicine

## 2023-01-31 ENCOUNTER — Emergency Department (HOSPITAL_COMMUNITY): Payer: PPO

## 2023-01-31 DIAGNOSIS — F3341 Major depressive disorder, recurrent, in partial remission: Secondary | ICD-10-CM | POA: Diagnosis present

## 2023-01-31 DIAGNOSIS — N1831 Chronic kidney disease, stage 3a: Secondary | ICD-10-CM | POA: Diagnosis not present

## 2023-01-31 DIAGNOSIS — I214 Non-ST elevation (NSTEMI) myocardial infarction: Secondary | ICD-10-CM | POA: Diagnosis not present

## 2023-01-31 DIAGNOSIS — R918 Other nonspecific abnormal finding of lung field: Secondary | ICD-10-CM | POA: Diagnosis not present

## 2023-01-31 DIAGNOSIS — J4520 Mild intermittent asthma, uncomplicated: Secondary | ICD-10-CM | POA: Diagnosis not present

## 2023-01-31 DIAGNOSIS — Z79899 Other long term (current) drug therapy: Secondary | ICD-10-CM | POA: Diagnosis not present

## 2023-01-31 DIAGNOSIS — Z7982 Long term (current) use of aspirin: Secondary | ICD-10-CM

## 2023-01-31 DIAGNOSIS — R079 Chest pain, unspecified: Secondary | ICD-10-CM | POA: Diagnosis not present

## 2023-01-31 DIAGNOSIS — Z85828 Personal history of other malignant neoplasm of skin: Secondary | ICD-10-CM

## 2023-01-31 DIAGNOSIS — Z888 Allergy status to other drugs, medicaments and biological substances status: Secondary | ICD-10-CM

## 2023-01-31 DIAGNOSIS — N183 Chronic kidney disease, stage 3 unspecified: Secondary | ICD-10-CM | POA: Insufficient documentation

## 2023-01-31 DIAGNOSIS — R0989 Other specified symptoms and signs involving the circulatory and respiratory systems: Secondary | ICD-10-CM | POA: Diagnosis not present

## 2023-01-31 DIAGNOSIS — Z86718 Personal history of other venous thrombosis and embolism: Secondary | ICD-10-CM | POA: Diagnosis not present

## 2023-01-31 DIAGNOSIS — I2510 Atherosclerotic heart disease of native coronary artery without angina pectoris: Secondary | ICD-10-CM | POA: Diagnosis present

## 2023-01-31 DIAGNOSIS — E785 Hyperlipidemia, unspecified: Secondary | ICD-10-CM | POA: Diagnosis not present

## 2023-01-31 DIAGNOSIS — Z955 Presence of coronary angioplasty implant and graft: Secondary | ICD-10-CM

## 2023-01-31 DIAGNOSIS — I739 Peripheral vascular disease, unspecified: Secondary | ICD-10-CM | POA: Diagnosis present

## 2023-01-31 DIAGNOSIS — J8283 Eosinophilic asthma: Secondary | ICD-10-CM | POA: Diagnosis present

## 2023-01-31 DIAGNOSIS — Z87891 Personal history of nicotine dependence: Secondary | ICD-10-CM | POA: Diagnosis not present

## 2023-01-31 DIAGNOSIS — I10 Essential (primary) hypertension: Secondary | ICD-10-CM | POA: Diagnosis not present

## 2023-01-31 DIAGNOSIS — Z7952 Long term (current) use of systemic steroids: Secondary | ICD-10-CM | POA: Diagnosis not present

## 2023-01-31 DIAGNOSIS — I129 Hypertensive chronic kidney disease with stage 1 through stage 4 chronic kidney disease, or unspecified chronic kidney disease: Secondary | ICD-10-CM | POA: Diagnosis present

## 2023-01-31 DIAGNOSIS — Z981 Arthrodesis status: Secondary | ICD-10-CM | POA: Diagnosis not present

## 2023-01-31 DIAGNOSIS — I7121 Aneurysm of the ascending aorta, without rupture: Secondary | ICD-10-CM | POA: Diagnosis present

## 2023-01-31 DIAGNOSIS — J310 Chronic rhinitis: Secondary | ICD-10-CM | POA: Diagnosis not present

## 2023-01-31 DIAGNOSIS — I252 Old myocardial infarction: Secondary | ICD-10-CM

## 2023-01-31 LAB — TROPONIN I (HIGH SENSITIVITY)
Troponin I (High Sensitivity): 1583 ng/L (ref <18)
Troponin I (High Sensitivity): 1743 ng/L (ref <18)
Troponin I (High Sensitivity): 1762 ng/L (ref <18)
Troponin I (High Sensitivity): 2166 ng/L (ref <18)

## 2023-01-31 LAB — BASIC METABOLIC PANEL
Anion gap: 6 (ref 5–15)
BUN: 16 mg/dL (ref 8–23)
CO2: 27 mmol/L (ref 22–32)
Calcium: 9.1 mg/dL (ref 8.9–10.3)
Chloride: 104 mmol/L (ref 98–111)
Creatinine, Ser: 1.34 mg/dL — ABNORMAL HIGH (ref 0.61–1.24)
GFR, Estimated: 58 mL/min — ABNORMAL LOW (ref >60)
Glucose, Bld: 124 mg/dL — ABNORMAL HIGH (ref 70–99)
Potassium: 4.3 mmol/L (ref 3.5–5.1)
Sodium: 137 mmol/L (ref 135–145)

## 2023-01-31 LAB — CBC
HCT: 47.1 % (ref 39.0–52.0)
Hemoglobin: 16.1 g/dL (ref 13.0–17.0)
MCH: 31.7 pg (ref 26.0–34.0)
MCHC: 34.2 g/dL (ref 30.0–36.0)
MCV: 92.7 fL (ref 80.0–100.0)
Platelets: 191 10*3/uL (ref 150–400)
RBC: 5.08 MIL/uL (ref 4.22–5.81)
RDW: 12.7 % (ref 11.5–15.5)
WBC: 5.5 10*3/uL (ref 4.0–10.5)
nRBC: 0 % (ref 0.0–0.2)

## 2023-01-31 LAB — D-DIMER, QUANTITATIVE: D-Dimer, Quant: 0.37 ug{FEU}/mL (ref 0.00–0.50)

## 2023-01-31 LAB — HEPARIN LEVEL (UNFRACTIONATED): Heparin Unfractionated: 0.39 [IU]/mL (ref 0.30–0.70)

## 2023-01-31 MED ORDER — HEPARIN BOLUS VIA INFUSION
4000.0000 [IU] | Freq: Once | INTRAVENOUS | Status: AC
  Administered 2023-01-31: 4000 [IU] via INTRAVENOUS

## 2023-01-31 MED ORDER — PANTOPRAZOLE SODIUM 40 MG PO TBEC
40.0000 mg | DELAYED_RELEASE_TABLET | Freq: Every day | ORAL | Status: DC
  Administered 2023-01-31 – 2023-02-03 (×4): 40 mg via ORAL
  Filled 2023-01-31 (×4): qty 1

## 2023-01-31 MED ORDER — PANTOPRAZOLE SODIUM 40 MG PO TBEC: 40.0000 mg | DELAYED_RELEASE_TABLET | Freq: Every day | ORAL | Status: DC

## 2023-01-31 MED ORDER — ATORVASTATIN CALCIUM 40 MG PO TABS: 40.0000 mg | ORAL_TABLET | Freq: Every day | ORAL | Status: DC

## 2023-01-31 MED ORDER — LOSARTAN POTASSIUM 25 MG PO TABS
25.0000 mg | ORAL_TABLET | Freq: Every day | ORAL | Status: DC
  Administered 2023-01-31 – 2023-02-03 (×4): 25 mg via ORAL
  Filled 2023-01-31 (×4): qty 1

## 2023-01-31 MED ORDER — HEPARIN (PORCINE) 25000 UT/250ML-% IV SOLN
1400.0000 [IU]/h | INTRAVENOUS | Status: DC
  Administered 2023-01-31: 1400 [IU]/h via INTRAVENOUS
  Filled 2023-01-31: qty 250

## 2023-01-31 MED ORDER — ATORVASTATIN CALCIUM 40 MG PO TABS
40.0000 mg | ORAL_TABLET | Freq: Every day | ORAL | Status: DC
  Administered 2023-02-01 – 2023-02-03 (×3): 40 mg via ORAL
  Filled 2023-01-31 (×4): qty 1

## 2023-01-31 MED ORDER — ESCITALOPRAM OXALATE 10 MG PO TABS: 10.0000 mg | ORAL_TABLET | Freq: Every day | ORAL | Status: DC

## 2023-01-31 MED ORDER — ESCITALOPRAM OXALATE 10 MG PO TABS
10.0000 mg | ORAL_TABLET | Freq: Every day | ORAL | Status: DC
  Administered 2023-01-31 – 2023-02-03 (×4): 10 mg via ORAL
  Filled 2023-01-31 (×4): qty 1

## 2023-01-31 MED ORDER — ASPIRIN 325 MG PO TABS
325.0000 mg | ORAL_TABLET | Freq: Once | ORAL | Status: AC
  Administered 2023-01-31: 325 mg via ORAL
  Filled 2023-01-31: qty 1

## 2023-01-31 NOTE — ED Triage Notes (Signed)
Pt with upper mid chest pain for a week that radiates to right shoulder at times.  Diaphoresis with waking up this morning.

## 2023-01-31 NOTE — ED Provider Notes (Signed)
Patient with an NSTEMI.  There are no beds available or [facility] for the patient to be admitted to cardiology.  Presently he is having minimal discomfort in his chest.  I spoke with Dr. Orson Aloe cardiology and he did not feel like the patient should be an ED to ED transfer since he is stable right now but patient can be admitted to medicine at [facility] and transferred to [facility] later.  He will probably not get a cath until Monday or Tuesday   Bethann Berkshire, MD 01/31/23 [id number]

## 2023-01-31 NOTE — Progress Notes (Signed)
PHARMACY - ANTICOAGULATION CONSULT NOTE  Pharmacy Consult for heparin Indication: chest pain/ACS  Allergies  Allergen Reactions   Symbicort [Budesonide-Formoterol Fumarate] Other (See Comments)    Allergic reaction jittery    Patient Measurements: Height: 6' (182.9 cm) Weight: 97.5 kg (215 lb) IBW/kg (Calculated) : 77.6 Heparin Dosing Weight: 97kg  Vital Signs: Temp: 98.2 F (36.8 C) (01/18 1131) Temp Source: Oral (01/18 1131) BP: 124/76 (01/18 1131) Pulse Rate: 82 (01/18 1131)  Labs: Recent Labs    01/31/23 1132  HGB 16.1  HCT 47.1  PLT 191  CREATININE 1.34*  TROPONINIHS 1,583*    Estimated Creatinine Clearance: 64.8 mL/min (A) (by C-G formula based on SCr of 1.34 mg/dL (H)).   Medical History: Past Medical History:  Diagnosis Date   Arthritis    Cancer (HCC)    basal cell on nose   COPD (chronic obstructive pulmonary disease) (HCC)    Depression    in the past   Peripheral vascular disease (HCC)    blood clot in left leg   Pneumonia     Assessment: 68 year old male presents to [facility] with chest pain for a week, now with elevated cardiac enzymes. New orders to start IV heparin, possible plans to transfer to [facility] for cath next week.   Goal of Therapy:  Heparin level 0.3-0.7 units/ml Monitor platelets by anticoagulation protocol: Yes   Plan:  Give 4000 units bolus x 1 Start heparin infusion at 1400 units/hr Check anti-Xa level in 6-8 hours and daily while on heparin Continue to monitor H&H and platelets  Sheppard Coil PharmD., BCPS Clinical Pharmacist 01/31/2023 1:33 PM

## 2023-01-31 NOTE — ED Notes (Signed)
Report given to male nurse at [facility], this nurse asked the nurses name and the nurse responded "I charted hand off so don't worry about it". Awaiting transport.

## 2023-01-31 NOTE — ED Provider Notes (Addendum)
Patient has gotten a bed over at [facility].  I contacted the cardiologist Dr. Orson Aloe and cardiology will continue admitting this patient to [facility].   Bethann Berkshire, MD 01/31/23 2226    Bethann Berkshire, MD 01/31/23 2226

## 2023-01-31 NOTE — ED Notes (Signed)
Care link given report to and on the way for transport of patient to [facility]

## 2023-01-31 NOTE — ED Notes (Signed)
Pt has a new Troponin of 2166, currently receiving heparin and is to be transported to a different facility

## 2023-01-31 NOTE — ED Provider Notes (Signed)
[facility] EMERGENCY DEPARTMENT AT [facility] Provider Note  CSN: [id number] Arrival date & time: 01/31/23 1117  Chief Complaint(s) Chest Pain  HPI James Harris is a 68 y.o. male here today for intermittent chest pain.  Patient says over the last 1 week, patient is having chest pain at rest.  He says that he will feel pressure across the top of his chest, with some radiation into his neck.  The symptoms usually last for couple of hours, before resolving on their own.  He spoke with his friend today was a physician who recommended he come to the emergency room.  Patient with prior history of DVT.   Past Medical History Past Medical History:  Diagnosis Date   Arthritis    Cancer (HCC)    basal cell on nose   COPD (chronic obstructive pulmonary disease) (HCC)    Depression    in the past   Peripheral vascular disease (HCC)    blood clot in left leg   Pneumonia    Patient Active Problem List   Diagnosis Date Noted   NSTEMI (non-ST elevated myocardial infarction) (HCC) 01/31/2023   Seasonal and perennial allergic rhinitis 12/21/2019   Mild intermittent asthma, uncomplicated 12/21/2019   Reactive airways disease 10/18/2019   Vitamin D deficiency 05/24/2019   Chronic obstructive pulmonary disease with acute exacerbation (HCC) 05/24/2019   Recurrent major depressive disorder, in partial remission (HCC) 05/24/2019   Cervical spondylosis with myelopathy and radiculopathy 11/23/2018   KNEE, ARTHRITIS, DEGEN./OSTEO 10/24/2008   CHONDROMALACIA PATELLA 10/24/2008   DERANGEMENT MENISCUS 08/23/2008   Home Medication(s) Prior to Admission medications   Medication Sig Start Date End Date Taking? Authorizing Provider  lisinopril (ZESTRIL) 20 MG tablet Take 20 mg by mouth daily. 11/17/22  Yes [provider]  losartan (COZAAR) 25 MG tablet Take 25 mg by mouth daily. 12/03/22  Yes [provider]  albuterol (PROVENTIL) (2.5 MG/3ML) 0.083% nebulizer solution Take  3 mLs (2.5 mg total) by nebulization every 6 (six) hours as needed for wheezing or shortness of breath. Patient not taking: Reported on 01/22/2022 05/24/19   Elenore Paddy, NP  albuterol (VENTOLIN HFA) 108 (90 Base) MCG/ACT inhaler Inhale 2 puffs into the lungs every 6 (six) hours as needed for wheezing or shortness of breath. Patient not taking: Reported on 01/22/2022    [provider]  cetirizine (ZYRTEC) 10 MG tablet Take 1 tablet (10 mg total) by mouth daily. Patient not taking: Reported on 01/22/2022 12/21/19   Alfonse Spruce, MD  Cholecalciferol (VITAMIN D-3) 125 MCG (5000 UT) TABS Take 2 tablets by mouth daily.    [provider]  diphenhydrAMINE (BENADRYL) 25 mg capsule Take 25 mg by mouth every 6 (six) hours as needed for allergies.    [provider]  escitalopram (LEXAPRO) 10 MG tablet Take 1 tablet (10 mg total) by mouth daily. 05/10/20   Elenore Paddy, NP  meclizine (ANTIVERT) 25 MG tablet Take 25 mg by mouth 3 (three) times daily as needed for dizziness.    [provider]  Multiple Vitamin (MULTIVITAMIN WITH MINERALS) TABS tablet Take 1 tablet by mouth daily.    [provider]  pantoprazole (PROTONIX) 40 MG tablet TAKE 30-60 MINUTES BEFORE YOUR FIRST MEAL AND LAST MEAL OF THE DAY 05/18/20   Nyoka Cowden, MD  predniSONE (DELTASONE) 20 MG tablet Take 20 mg by mouth daily. 01/22/20   [provider]  Pseudoephedrine-APAP-DM (DAYQUIL PO) Take by mouth daily as  needed (cold).    [provider]                                                                                                                                    Past Surgical History Past Surgical History:  Procedure Laterality Date   ANTERIOR CERVICAL DECOMP/DISCECTOMY FUSION N/A 11/23/2018   Procedure: Anterior Cervical Discectomy Fusion - Cervical Five-Cervical Six - Cervical Six-Cervical Seven;  Surgeon: Julio Sicks, MD;  Location: [facility] OR;  Service:  Neurosurgery;  Laterality: N/A;  Anterior Cervical Discectomy Fusion - Cervical Five-Cervical Six - Cervical Six-Cervical Seven   KNEE SURGERY  bilateral   TONSILLECTOMY     x 2   Family History Family History  Family history unknown: Yes    Social History Social History   Tobacco Use   Smoking status: Former    Current packs/day: 0.00    Average packs/day: 1.5 packs/day for 20.0 years (30.0 ttl pk-yrs)    Types: Cigarettes    Start date: 01/14/1964    Quit date: 01/14/1984    Years since quitting: 39.0   Smokeless tobacco: Never  Vaping Use   Vaping status: Never Used  Substance Use Topics   Alcohol use: Yes    Comment: rarely   Drug use: No   Allergies Symbicort [budesonide-formoterol fumarate]  Review of Systems Review of Systems  Physical Exam Vital Signs  I have reviewed the triage vital signs BP 124/76   Pulse 82   Temp 98.2 F (36.8 C) (Oral)   Resp 15   Ht 6' (1.829 m)   Wt 97.5 kg   SpO2 98%   BMI 29.16 kg/m   Physical Exam Vitals reviewed.  Cardiovascular:     Heart sounds: Normal heart sounds.     No friction rub.  Pulmonary:     Effort: Pulmonary effort is normal.     Breath sounds: No decreased breath sounds.  Chest:     Chest wall: No mass or tenderness.  Abdominal:     Palpations: Abdomen is soft.  Neurological:     General: No focal deficit present.     Mental Status: He is alert.     ED Results and Treatments Labs (all labs ordered are listed, but only abnormal results are displayed) Labs Reviewed  BASIC METABOLIC PANEL - Abnormal; Notable for the following components:      Result Value   Glucose, Bld 124 (*)    Creatinine, Ser 1.34 (*)    GFR, Estimated 58 (*)    All other components within normal limits  TROPONIN I (HIGH SENSITIVITY) - Abnormal; Notable for the following components:   Troponin I (High Sensitivity) 1,583 (*)    All other components within normal limits  CBC  D-DIMER, QUANTITATIVE  HEPARIN LEVEL  (UNFRACTIONATED)  TROPONIN I (HIGH SENSITIVITY)  Radiology DG Chest Port 1 View Result Date: 01/31/2023 CLINICAL DATA:  Pain in chest for a week. EXAM: PORTABLE CHEST 1 VIEW COMPARISON:  01/22/2022 FINDINGS: Stable cardiomediastinal contours. Lung volumes are low. Chronic diffuse bronchial wall thickening. No superimposed pleural effusion, interstitial edema or airspace disease. Postop change noted in the cervical spine. IMPRESSION: No acute abnormality. Low lung volumes and chronic bronchial wall thickening. Electronically Signed   By: Signa Kell M.D.   On: 01/31/2023 12:04    Pertinent labs & imaging results that were available during my care of the patient were reviewed by me and considered in my medical decision making (see MDM for details).  Medications Ordered in ED Medications  heparin ADULT infusion 100 units/mL (25000 units/211mL) (1,400 Units/hr Intravenous New Bag/Given 01/31/23 1308)  atorvastatin (LIPITOR) tablet 40 mg (has no administration in time range)  escitalopram (LEXAPRO) tablet 10 mg (has no administration in time range)  pantoprazole (PROTONIX) EC tablet 40 mg (has no administration in time range)  atorvastatin (LIPITOR) tablet 40 mg (has no administration in time range)  aspirin tablet 325 mg (325 mg Oral Given 01/31/23 1242)  heparin bolus via infusion 4,000 Units (4,000 Units Intravenous Bolus from Bag 01/31/23 1302)                                                                                                                                     Procedures .Critical Care  Performed by: Arletha Pili, DO Authorized by: Arletha Pili, DO   Critical care provider statement:    Critical care time (minutes):  30   Critical care was necessary to treat or prevent imminent or life-threatening deterioration of the following conditions:   Cardiac failure   Critical care was time spent personally by me on the following activities:  Development of treatment plan with patient or surrogate, discussions with consultants, evaluation of patient's response to treatment, examination of patient, ordering and review of laboratory studies, ordering and review of radiographic studies, ordering and performing treatments and interventions, pulse oximetry, re-evaluation of patient's condition and review of old charts   (including critical care time)  Medical Decision Making / ED Course   This patient presents to the ED for concern of chest pain, this involves an extensive number of treatment options, and is a complaint that carries with it a high risk of complications and morbidity.  The differential diagnosis includes ACS, PE, less likely dissection, unstable angina, pneumonia, pneumonitis, myocarditis.  MDM: Patient symptoms are concerning for unstable angina.  His initial EKG does not show any signs of acute ischemia.  Pending basic labs, troponin testing.  Dimer ordered given patient's prior history of DVT.  Reassessment 12:30 PM-patient with a troponin greater than 1500.  Provided with aspirin, started on heparin.  Have placed cardiology consultation.  Symptoms are more consistent with ACS, symptoms less consistent with myocarditis.  No fluid overload, chest x-ray clear.  Reassessment 1:20 PM-spoke with Dr.Chandrasekhar from cardiology who recommends transfer to [facility] with a plan for cardiac catheterization on Monday.  Patient still not experiencing chest pain.  Home meds ordered, serial troponins ordered.   Additional history obtained: -Additional history obtained from patient's friend -External records from outside source obtained and reviewed including: Chart review including previous notes, labs, imaging, consultation notes   Lab Tests: -I ordered, reviewed, and interpreted labs.   The pertinent results include:   Labs  Reviewed  BASIC METABOLIC PANEL - Abnormal; Notable for the following components:      Result Value   Glucose, Bld 124 (*)    Creatinine, Ser 1.34 (*)    GFR, Estimated 58 (*)    All other components within normal limits  TROPONIN I (HIGH SENSITIVITY) - Abnormal; Notable for the following components:   Troponin I (High Sensitivity) 1,583 (*)    All other components within normal limits  CBC  D-DIMER, QUANTITATIVE  HEPARIN LEVEL (UNFRACTIONATED)  TROPONIN I (HIGH SENSITIVITY)      EKG T wave inversions in lead III, no acute ischemia.  EKG Interpretation Date/Time:    Ventricular Rate:    PR Interval:    QRS Duration:    QT Interval:    QTC Calculation:   R Axis:      Text Interpretation:           Imaging Studies ordered: I ordered imaging studies including chest x-ray I independently visualized and interpreted imaging. I agree with the radiologist interpretation   Medicines ordered and prescription drug management: Meds ordered this encounter  Medications   aspirin tablet 325 mg   heparin bolus via infusion 4,000 Units   heparin ADULT infusion 100 units/mL (25000 units/231mL)   atorvastatin (LIPITOR) tablet 40 mg   escitalopram (LEXAPRO) tablet 10 mg   pantoprazole (PROTONIX) EC tablet 40 mg   atorvastatin (LIPITOR) tablet 40 mg    -I have reviewed the patients home medicines and have made adjustments as needed  Critical interventions Management of NSTEMI  Cardiac Monitoring: The patient was maintained on a cardiac monitor.  I personally viewed and interpreted the cardiac monitored which showed an underlying rhythm of: Normal sinus rhythm  Social Determinants of Health:  Factors impacting patients care include:    Reevaluation: After the interventions noted above, I reevaluated the patient and found that they have :improved  Co morbidities that complicate the patient evaluation  Past Medical History:  Diagnosis Date   Arthritis    Cancer (HCC)     basal cell on nose   COPD (chronic obstructive pulmonary disease) (HCC)    Depression    in the past   Peripheral vascular disease (HCC)    blood clot in left leg   Pneumonia       Dispostion: Transfer to [facility]     Final Clinical Impression(s) / ED Diagnoses Final diagnoses:  NSTEMI (non-ST elevated myocardial infarction) (HCC)     @PCDICTATION @    Anders Simmonds T, DO 01/31/23 1321

## 2023-01-31 NOTE — Progress Notes (Signed)
PHARMACY - ANTICOAGULATION CONSULT NOTE  Pharmacy Consult for heparin Indication: chest pain/ACS  Allergies  Allergen Reactions   Symbicort [Budesonide-Formoterol Fumarate] Other (See Comments)    Allergic reaction jittery    Patient Measurements: Height: 6' (182.9 cm) Weight: 97.5 kg (215 lb) IBW/kg (Calculated) : 77.6 Heparin Dosing Weight: 97kg  Vital Signs: Temp: 98.8 F (37.1 C) (01/18 1539) Temp Source: Oral (01/18 1539) BP: 133/75 (01/18 2220) Pulse Rate: 71 (01/18 2220)  Labs: Recent Labs    01/31/23 1132 01/31/23 1448 01/31/23 1752 01/31/23 2054  HGB 16.1  --   --   --   HCT 47.1  --   --   --   PLT 191  --   --   --   HEPARINUNFRC  --   --   --  0.39  CREATININE 1.34*  --   --   --   TROPONINIHS 1,583* 1,743* 1,762* 2,166*    Estimated Creatinine Clearance: 64.8 mL/min (A) (by C-G formula based on SCr of 1.34 mg/dL (H)).   Medical History: Past Medical History:  Diagnosis Date   Arthritis    Cancer (HCC)    basal cell on nose   COPD (chronic obstructive pulmonary disease) (HCC)    Depression    in the past   Peripheral vascular disease (HCC)    blood clot in left leg   Pneumonia     Assessment: 68 year old male presents to [facility] with chest pain for a week, now with elevated cardiac enzymes. New orders to start IV heparin, possible plans to transfer to [facility] for cath next week.   1/18 PM: heparin level 0.39 on 1400 units/hr (therapeutic). No issues documented with heparin running continuously or signs/symptoms of bleeding  Goal of Therapy:  Heparin level 0.3-0.7 units/ml Monitor platelets by anticoagulation protocol: Yes   Plan:  Continue heparin infusion at 1400 units/hr Check confirmatory anti-Xa level with AM labs and daily while on heparin Continue to monitor H&H and platelets  Arabella Merles, PharmD. Clinical Pharmacist 01/31/2023 10:28 PM

## 2023-01-31 NOTE — ED Notes (Signed)
ED TO INPATIENT HANDOFF REPORT  ED Nurse Name and Phone #:   S Name/Age/Gender James Harris 68 y.o. male Room/Bed: APA06/APA06  Code Status   Code Status: Prior  Home/SNF/Other Home Patient oriented to: self, place, time, and situation Is this baseline? Yes   Triage Complete: Triage complete  Chief Complaint NSTEMI (non-ST elevated myocardial infarction) (HCC) [I21.4]  Triage Note Pt with upper mid chest pain for a week that radiates to right shoulder at times.  Diaphoresis with waking up this morning.    Allergies Allergies  Allergen Reactions   Symbicort [Budesonide-Formoterol Fumarate] Other (See Comments)    Allergic reaction jittery    Level of Care/Admitting Diagnosis ED Disposition     ED Disposition  Admit   Condition  --   Comment  Hospital [facility] [[id number]]  Level of Care: Telemetry Cardiac [[id number]]  May admit patient to [facility] or [facility] if equivalent level of care is available:: No  Interfacility transfer: Yes  Covid Evaluation: Asymptomatic - no recent exposure (last 10 days) testing not required  Diagnosis: NSTEMI (non-ST elevated myocardial infarction) [facility]) [[id number]]  Admitting Physician: Christell Constant [[id number]]  Attending Physician: Riley Lam A [[id number]]  Certification:: I certify this patient will need inpatient services for at least 2 midnights  Expected Medical Readiness: 02/04/2023          B Medical/Surgery History Past Medical History:  Diagnosis Date   Arthritis    Cancer (HCC)    basal cell on nose   COPD (chronic obstructive pulmonary disease) (HCC)    Depression    in the past   Peripheral vascular disease (HCC)    blood clot in left leg   Pneumonia    Past Surgical History:  Procedure Laterality Date   ANTERIOR CERVICAL DECOMP/DISCECTOMY FUSION N/A 11/23/2018   Procedure: Anterior Cervical Discectomy Fusion - Cervical Five-Cervical Six - Cervical Six-Cervical  Seven;  Surgeon: Julio Sicks, MD;  Location: [facility] OR;  Service: Neurosurgery;  Laterality: N/A;  Anterior Cervical Discectomy Fusion - Cervical Five-Cervical Six - Cervical Six-Cervical Seven   KNEE SURGERY  bilateral   TONSILLECTOMY     x 2     A IV Location/Drains/Wounds Patient Lines/Drains/Airways Status     Active Line/Drains/Airways     Name Placement date Placement time Site Days   Peripheral IV 01/31/23 20 G Anterior;Distal;Left;Upper Arm 01/31/23  1120  Arm  less than 1   Wound / Incision (Open or Dehisced) 10/05/13 Laceration Head Right Lac 3.75 cm in length; unable to view depth; edges non-jagged 10/05/13  1135  Head  3405            Intake/Output Last 24 hours No intake or output data in the 24 hours ending 01/31/23 2021  Labs/Imaging Results for orders placed or performed during the hospital encounter of 01/31/23 (from the past 48 hours)  Basic metabolic panel     Status: Abnormal   Collection Time: 01/31/23 11:32 AM  Result Value Ref Range   Sodium 137 135 - 145 mmol/L   Potassium 4.3 3.5 - 5.1 mmol/L   Chloride 104 98 - 111 mmol/L   CO2 27 22 - 32 mmol/L   Glucose, Bld 124 (H) 70 - 99 mg/dL    Comment: Glucose reference range applies only to samples taken after fasting for at least 8 hours.   BUN 16 8 - 23 mg/dL   Creatinine, Ser 3.32 (H) 0.61 - 1.24 mg/dL   Calcium  9.1 8.9 - 10.3 mg/dL   GFR, Estimated 58 (L) >60 mL/min    Comment: (NOTE) Calculated using the CKD-EPI Creatinine Equation (2021)    Anion gap 6 5 - 15    Comment: Performed at [facility], [address] [place], [place] [address]  CBC     Status: None   Collection Time: 01/31/23 11:32 AM  Result Value Ref Range   WBC 5.5 4.0 - 10.5 K/uL   RBC 5.08 4.22 - 5.81 MIL/uL   Hemoglobin 16.1 13.0 - 17.0 g/dL   HCT 69.6 29.5 - 28.4 %   MCV 92.7 80.0 - 100.0 fL   MCH 31.7 26.0 - 34.0 pg   MCHC 34.2 30.0 - 36.0 g/dL   RDW 13.2 44.0 - 10.2 %   Platelets 191 150 - 400 K/uL   nRBC 0.0 0.0 - 0.2  %    Comment: Performed at [facility], [address], [place], [place] [address]  Troponin I (High Sensitivity)     Status: Abnormal   Collection Time: 01/31/23 11:32 AM  Result Value Ref Range   Troponin I (High Sensitivity) 1,583 (HH) <18 ng/L    Comment: CRITICAL RESULT CALLED TO, READ BACK BY AND VERIFIED WITH M. Rosalyn Charters ON 01/31/2023 @12 :17 BY T.HAMER (NOTE) Elevated high sensitivity troponin I (hsTnI) values and significant  changes across serial measurements may suggest ACS but many other  chronic and acute conditions are known to elevate hsTnI results.  Refer to the "Links" section for chest pain algorithms and additional  guidance. Performed at [facility], [address], [place], [place] [address]   D-dimer, quantitative     Status: None   Collection Time: 01/31/23 11:32 AM  Result Value Ref Range   D-Dimer, Quant 0.37 0.00 - 0.50 ug/mL-FEU    Comment: (NOTE) At the manufacturer cut-off value of 0.5 g/mL FEU, this assay has a negative predictive value of 95-100%.This assay is intended for use in conjunction with a clinical pretest probability (PTP) assessment model to exclude pulmonary embolism (PE) and deep venous thrombosis (DVT) in outpatients suspected of PE or DVT. Results should be correlated with clinical presentation. Performed at [facility], [address], [place], [place] [address]   Troponin I (High Sensitivity)     Status: Abnormal   Collection Time: 01/31/23  2:48 PM  Result Value Ref Range   Troponin I (High Sensitivity) 1,743 (HH) <18 ng/L    Comment: CRITICAL RESULT CALLED TO, READ BACK BY AND VERIFIED WITH ERICKA R. ON 01/31/2023 @15 :28 BY T.HAMER. DELTA CHECK NOTED (NOTE) Elevated high sensitivity troponin I (hsTnI) values and significant  changes across serial measurements may suggest ACS but many other  chronic and acute conditions are known to elevate hsTnI results.  Refer to the "Links" section for chest pain algorithms and additional   guidance. Performed at [facility], [address], [place], [place] [address]   Troponin I (High Sensitivity)     Status: Abnormal   Collection Time: 01/31/23  5:52 PM  Result Value Ref Range   Troponin I (High Sensitivity) 1,762 (HH) <18 ng/L    Comment: CRITICAL RESULT CALLED TO, READ BACK BY AND VERIFIED WITH C. TUNNER ON 01/31/2023 @19 :20 by T.HAMER. (NOTE) Elevated high sensitivity troponin I (hsTnI) values and significant  changes across serial measurements may suggest ACS but many other  chronic and acute conditions are known to elevate hsTnI results.  Refer to the "Links" section for chest pain algorithms and additional  guidance. Performed  at [facility], [address], [place], [place] [address]    DG Chest Port 1 View Result Date: 01/31/2023 CLINICAL DATA:  Pain in chest for a week. EXAM: PORTABLE CHEST 1 VIEW COMPARISON:  01/22/2022 FINDINGS: Stable cardiomediastinal contours. Lung volumes are low. Chronic diffuse bronchial wall thickening. No superimposed pleural effusion, interstitial edema or airspace disease. Postop change noted in the cervical spine. IMPRESSION: No acute abnormality. Low lung volumes and chronic bronchial wall thickening. Electronically Signed   By: Signa Kell M.D.   On: 01/31/2023 12:04    Pending Labs Unresulted Labs (From admission, onward)     Start     Ordered   02/01/23 0500  Heparin level (unfractionated)  Daily,   R      01/31/23 1252   02/01/23 0500  CBC  Daily,   R      01/31/23 1252   01/31/23 2000  Heparin level (unfractionated)  Once-Timed,   URGENT        01/31/23 1252            Vitals/Pain Today's Vitals   01/31/23 1131 01/31/23 1430 01/31/23 1539 01/31/23 1556  BP:  113/75  132/82  Pulse:  70  67  Resp:  14  18  Temp: 98.2 F (36.8 C)  98.8 F (37.1 C)   TempSrc: Oral  Oral   SpO2:  96%  95%  Weight:      Height:      PainSc:    0-No pain    Isolation Precautions No active  isolations  Medications Medications  heparin ADULT infusion 100 units/mL (25000 units/243mL) (1,400 Units/hr Intravenous New Bag/Given 01/31/23 1308)  escitalopram (LEXAPRO) tablet 10 mg (10 mg Oral Given 01/31/23 1405)  pantoprazole (PROTONIX) EC tablet 40 mg (40 mg Oral Given 01/31/23 1406)  atorvastatin (LIPITOR) tablet 40 mg (40 mg Oral Patient Refused/Not Given 01/31/23 1409)  losartan (COZAAR) tablet 25 mg (25 mg Oral Given 01/31/23 1440)  aspirin tablet 325 mg (325 mg Oral Given 01/31/23 1242)  heparin bolus via infusion 4,000 Units (4,000 Units Intravenous Bolus from Bag 01/31/23 1302)    Mobility walks     Focused Assessments    R Recommendations: See Admitting Provider Note  Report given to:   Additional Notes:

## 2023-01-31 NOTE — ED Notes (Signed)
Pharmacists called to inform Hep levels were good, wanted to ensure pt was having no signs

## 2023-01-31 NOTE — ED Notes (Signed)
Critical troponin 1743, charted and sent to MD

## 2023-02-01 ENCOUNTER — Inpatient Hospital Stay (HOSPITAL_COMMUNITY): Payer: PPO

## 2023-02-01 DIAGNOSIS — I10 Essential (primary) hypertension: Secondary | ICD-10-CM

## 2023-02-01 DIAGNOSIS — I214 Non-ST elevation (NSTEMI) myocardial infarction: Principal | ICD-10-CM

## 2023-02-01 LAB — HIV ANTIBODY (ROUTINE TESTING W REFLEX): HIV Screen 4th Generation wRfx: NONREACTIVE

## 2023-02-01 LAB — CBC
HCT: 40.8 % (ref 39.0–52.0)
Hemoglobin: 14.3 g/dL (ref 13.0–17.0)
MCH: 31.8 pg (ref 26.0–34.0)
MCHC: 35 g/dL (ref 30.0–36.0)
MCV: 90.9 fL (ref 80.0–100.0)
Platelets: 174 10*3/uL (ref 150–400)
RBC: 4.49 MIL/uL (ref 4.22–5.81)
RDW: 12.8 % (ref 11.5–15.5)
WBC: 6.1 10*3/uL (ref 4.0–10.5)
nRBC: 0 % (ref 0.0–0.2)

## 2023-02-01 LAB — BASIC METABOLIC PANEL
Anion gap: 8 (ref 5–15)
BUN: 14 mg/dL (ref 8–23)
CO2: 23 mmol/L (ref 22–32)
Calcium: 8.5 mg/dL — ABNORMAL LOW (ref 8.9–10.3)
Chloride: 102 mmol/L (ref 98–111)
Creatinine, Ser: 1.22 mg/dL (ref 0.61–1.24)
GFR, Estimated: >60 mL/min (ref >60)
Glucose, Bld: 90 mg/dL (ref 70–99)
Potassium: 3.8 mmol/L (ref 3.5–5.1)
Sodium: 133 mmol/L — ABNORMAL LOW (ref 135–145)

## 2023-02-01 LAB — LIPID PANEL
Cholesterol: 147 mg/dL (ref 0–200)
HDL: 28 mg/dL — ABNORMAL LOW (ref >40)
LDL Cholesterol: 108 mg/dL — ABNORMAL HIGH (ref 0–99)
Total CHOL/HDL Ratio: 5.3 {ratio}
Triglycerides: 55 mg/dL (ref <150)
VLDL: 11 mg/dL (ref 0–40)

## 2023-02-01 LAB — ECHOCARDIOGRAM COMPLETE
Area-P 1/2: 2.44 cm2
Height: 72 in
P 1/2 time: 634 ms
S' Lateral: 4 cm
Weight: 3388.8 [oz_av]

## 2023-02-01 LAB — HEPARIN LEVEL (UNFRACTIONATED): Heparin Unfractionated: 0.42 [IU]/mL (ref 0.30–0.70)

## 2023-02-01 MED ORDER — NITROGLYCERIN 0.4 MG SL SUBL
0.4000 mg | SUBLINGUAL_TABLET | SUBLINGUAL | Status: DC | PRN
Start: 2023-02-01 — End: 2023-02-03

## 2023-02-01 MED ORDER — LOSARTAN POTASSIUM 25 MG PO TABS: 25.0000 mg | ORAL_TABLET | Freq: Every day | ORAL | Status: DC

## 2023-02-01 MED ORDER — ASPIRIN 81 MG PO CHEW
81.0000 mg | CHEWABLE_TABLET | ORAL | Status: AC
  Administered 2023-02-02: 81 mg via ORAL
  Filled 2023-02-01: qty 1

## 2023-02-01 MED ORDER — VITAMIN D 25 MCG (1000 UNIT) PO TABS
10000.0000 [IU] | ORAL_TABLET | Freq: Every day | ORAL | Status: DC
  Administered 2023-02-01 – 2023-02-03 (×3): 10000 [IU] via ORAL
  Filled 2023-02-01 (×3): qty 10

## 2023-02-01 MED ORDER — SODIUM CHLORIDE 0.9 % WEIGHT BASED INFUSION
1.0000 mL/kg/h | INTRAVENOUS | Status: DC
  Administered 2023-02-02: 1 mL/kg/h via INTRAVENOUS

## 2023-02-01 MED ORDER — HEPARIN (PORCINE) 25000 UT/250ML-% IV SOLN
1400.0000 [IU]/h | INTRAVENOUS | Status: DC
  Administered 2023-02-01 (×2): 1400 [IU]/h via INTRAVENOUS
  Filled 2023-02-01 (×2): qty 250

## 2023-02-01 MED ORDER — ASPIRIN 81 MG PO TBEC
81.0000 mg | DELAYED_RELEASE_TABLET | Freq: Every day | ORAL | Status: DC
  Administered 2023-02-02 – 2023-02-03 (×2): 81 mg via ORAL
  Filled 2023-02-01 (×2): qty 1

## 2023-02-01 MED ORDER — METOPROLOL TARTRATE 25 MG PO TABS
25.0000 mg | ORAL_TABLET | Freq: Two times a day (BID) | ORAL | Status: DC
  Administered 2023-02-01 – 2023-02-03 (×6): 25 mg via ORAL
  Filled 2023-02-01 (×6): qty 1

## 2023-02-01 MED ORDER — ALBUTEROL SULFATE (2.5 MG/3ML) 0.083% IN NEBU: 2.5000 mg | INHALATION_SOLUTION | Freq: Four times a day (QID) | RESPIRATORY_TRACT | Status: DC | PRN

## 2023-02-01 MED ORDER — ACETAMINOPHEN 325 MG PO TABS: 650.0000 mg | ORAL_TABLET | ORAL | Status: DC | PRN

## 2023-02-01 MED ORDER — SODIUM CHLORIDE 0.9 % WEIGHT BASED INFUSION
3.0000 mL/kg/h | INTRAVENOUS | Status: DC
  Administered 2023-02-02: 3 mL/kg/h via INTRAVENOUS

## 2023-02-01 MED ORDER — ONDANSETRON HCL 4 MG/2ML IJ SOLN: 4.0000 mg | Freq: Four times a day (QID) | INTRAMUSCULAR | Status: DC | PRN

## 2023-02-01 NOTE — H&P (Signed)
Cardiology Admission History and Physical   Patient ID: James Harris MRN: [id number]; DOB: 1955/06/13   Admission date: 01/31/2023  PCP:  Benita Stabile, MD   [facility] Providers Cardiologist:  None        Chief Complaint:  Chest pain  Patient Profile:   James Harris is a 68 y.o. male with a significant past medical history of hypertension, previous cigarette use, stage IIIa chronic kidney disease, probable reactive airway disease, and prior deep vein thrombosis who is being seen 02/01/2023 for the evaluation of chest pain/NSTEMI-ACS.  History of Present Illness:   James Harris states at around 7:30 pm 6 days prior, he suddenly noticed chest pain at his upper chest.  Pain described as a feeling of indigestion and tightness.  The pain lasted a couple of hours and eventually resolved.  He did wake up sweating and noticed nausea at the time of his initial chest pain.  The next day, while walking around his home in the morning, the pain recurred with radiation to his neck and both shoulders.  He reports intermittent chest pain throughout the day for the past several days that would last several minutes and go away on its own.  He eventually took himself to [facility] Department yesterday for further evaluation.  He is now chest pain free.  He denies any prior history of similar chest pain or heart attacks.     At [facility], his initial HS-troponin was 1,583 ng/L.  He was given high-dose aspirin and started on a heparin drip.  Patient was accepted by our cardiology group for NSTEMI-ACS management.    Past Medical History:  Diagnosis Date   Arthritis    Cancer (HCC)    basal cell on nose   COPD (chronic obstructive pulmonary disease) (HCC)    Depression    in the past   Peripheral vascular disease (HCC)    blood clot in left leg   Pneumonia     Past Surgical History:  Procedure Laterality Date   ANTERIOR CERVICAL DECOMP/DISCECTOMY FUSION N/A 11/23/2018    Procedure: Anterior Cervical Discectomy Fusion - Cervical Five-Cervical Six - Cervical Six-Cervical Seven;  Surgeon: Julio Sicks, MD;  Location: [facility] OR;  Service: Neurosurgery;  Laterality: N/A;  Anterior Cervical Discectomy Fusion - Cervical Five-Cervical Six - Cervical Six-Cervical Seven   KNEE SURGERY  bilateral   TONSILLECTOMY     x 2     Medications Prior to Admission: Prior to Admission medications   Medication Sig Start Date End Date Taking? Authorizing Provider  albuterol (PROVENTIL) (2.5 MG/3ML) 0.083% nebulizer solution Take 3 mLs (2.5 mg total) by nebulization every 6 (six) hours as needed for wheezing or shortness of breath. 05/24/19  Yes Elenore Paddy, NP  albuterol (VENTOLIN HFA) 108 (90 Base) MCG/ACT inhaler Inhale 2 puffs into the lungs every 6 (six) hours as needed for wheezing or shortness of breath.   Yes [provider]  Cholecalciferol (VITAMIN D-3) 125 MCG (5000 UT) TABS Take 2 tablets by mouth daily.   Yes [provider]  escitalopram (LEXAPRO) 10 MG tablet Take 1 tablet (10 mg total) by mouth daily. 05/10/20  Yes Elenore Paddy, NP  losartan (COZAAR) 25 MG tablet Take 25 mg by mouth daily. 12/03/22  Yes [provider]  meclizine (ANTIVERT) 25 MG tablet Take 25 mg by mouth daily as needed for dizziness.   Yes [provider]  Multiple Vitamin (MULTIVITAMIN WITH MINERALS) TABS tablet  Take 1 tablet by mouth daily.   Yes [provider]  pantoprazole (PROTONIX) 40 MG tablet TAKE 30-60 MINUTES BEFORE YOUR FIRST MEAL AND LAST MEAL OF THE DAY Patient taking differently: Take 40 mg by mouth daily. 05/18/20  Yes Nyoka Cowden, MD  predniSONE (DELTASONE) 20 MG tablet Take 20 mg by mouth daily as needed (for breathing). 01/22/20  Yes [provider]     Allergies:    Allergies  Allergen Reactions   Symbicort [Budesonide-Formoterol Fumarate] Other (See Comments)    Allergic reaction jittery    Social History:   Social  History   Socioeconomic History   Marital status: Married    Spouse name: Not on file   Number of children: Not on file   Years of education: Not on file   Highest education level: Not on file  Occupational History   Not on file  Tobacco Use   Smoking status: Former    Current packs/day: 0.00    Average packs/day: 1.5 packs/day for 20.0 years (30.0 ttl pk-yrs)    Types: Cigarettes    Start date: 01/14/1964    Quit date: 01/14/1984    Years since quitting: 39.0   Smokeless tobacco: Never  Vaping Use   Vaping status: Never Used  Substance and Sexual Activity   Alcohol use: Yes    Comment: rarely   Drug use: No   Sexual activity: Not on file  Other Topics Concern   Not on file  Social History Narrative   Not on file   Social Drivers of Health   Financial Resource Strain: Not on file  Food Insecurity: No Food Insecurity (02/01/2023)   Hunger Vital Sign    Worried About Running Out of Food in the Last Year: Never true    Ran Out of Food in the Last Year: Never true  Transportation Needs: No Transportation Needs (02/01/2023)   PRAPARE - Administrator, Civil Service (Medical): No    Lack of Transportation (Non-Medical): No  Physical Activity: Not on file  Stress: Not on file  Social Connections: Unknown (02/01/2023)   Social Connection and Isolation Panel [NHANES]    Frequency of Communication with Friends and Family: More than three times a week    Frequency of Social Gatherings with Friends and Family: More than three times a week    Attends Religious Services: Patient declined    Database administrator or Organizations: Patient declined    Attends Banker Meetings: Patient declined    Marital Status: Married  Catering manager Violence: Not At Risk (02/01/2023)   Humiliation, Afraid, Rape, and Kick questionnaire    Fear of Current or Ex-Partner: No    Emotionally Abused: No    Physically Abused: No    Sexually Abused: No    Family History:   The  patient's Family history is unknown by patient.    ROS:  Please see the history of present illness.  All other ROS reviewed and negative.     Physical Exam/Data:   Vitals:   01/31/23 2200 01/31/23 2220 01/31/23 2356 02/01/23 0019  BP: 132/75 133/75 137/74   Pulse: 68 71 63   Resp:  20 18   Temp:   97.9 F (36.6 C)   TempSrc:   Oral   SpO2: 95% 93% 95%   Weight:    96.1 kg  Height:       No intake or output data in the 24 hours ending 02/01/23  0140    02/01/2023   12:19 AM 01/31/2023   11:23 AM 01/22/2022   11:01 AM  Last 3 Weights  Weight (lbs) 211 lb 12.8 oz 215 lb 220 lb  Weight (kg) 96.072 kg 97.523 kg 99.791 kg     Body mass index is 28.73 kg/m.  General:  Well nourished, well developed, in no acute distress HEENT: normal Neck: no JVD Vascular: No carotid bruits; Distal pulses 2+ bilaterally   Cardiac:  normal S1, S2; RRR; 1/6 holosystolic murmur heard best RUSB.    Lungs:  clear to auscultation bilaterally, no wheezing, rhonchi or rales  Abd: soft, nontender, no hepatomegaly  Ext: no edema Musculoskeletal:  No deformities, BUE and BLE strength normal and equal Skin: warm and dry  Neuro:  CNs 2-12 intact, no focal abnormalities noted Psych:  Normal affect    EKG:  01/31/23 (11:04pm):  NSR; normal ECG  Relevant CV Studies: None  Laboratory Data:  High Sensitivity Troponin:   Recent Labs  Lab 01/31/23 1132 01/31/23 1448 01/31/23 1752 01/31/23 2054  TROPONINIHS 1,583* 1,743* 1,762* 2,166*      Chemistry Recent Labs  Lab 01/31/23 1132  NA 137  K 4.3  CL 104  CO2 27  GLUCOSE 124*  BUN 16  CREATININE 1.34*  CALCIUM 9.1  GFRNONAA 58*  ANIONGAP 6    No results for input(s): "PROT", "ALBUMIN", "AST", "ALT", "ALKPHOS", "BILITOT" in the last 168 hours. Lipids No results for input(s): "CHOL", "TRIG", "HDL", "LABVLDL", "LDLCALC", "CHOLHDL" in the last 168 hours. Hematology Recent Labs  Lab 01/31/23 1132  WBC 5.5  RBC 5.08  HGB 16.1  HCT 47.1   MCV 92.7  MCH 31.7  MCHC 34.2  RDW 12.7  PLT 191   Thyroid No results for input(s): "TSH", "FREET4" in the last 168 hours. BNPNo results for input(s): "BNP", "PROBNP" in the last 168 hours.  DDimer  Recent Labs  Lab 01/31/23 1132  DDIMER 0.37     Radiology/Studies:  [facility] Chest Port 1 View Result Date: 01/31/2023 CLINICAL DATA:  Pain in chest for a week. EXAM: PORTABLE CHEST 1 VIEW COMPARISON:  01/22/2022 FINDINGS: Stable cardiomediastinal contours. Lung volumes are low. Chronic diffuse bronchial wall thickening. No superimposed pleural effusion, interstitial edema or airspace disease. Postop change noted in the cervical spine. IMPRESSION: No acute abnormality. Low lung volumes and chronic bronchial wall thickening. Electronically Signed   By: Signa Kell M.D.   On: 01/31/2023 12:04     Assessment and Plan:   Chest pain/NSTEMI-ACS: Patient with chest pain intermittent for several days in the setting of rising HS-troponin levels, concerning for NSTEMI-ACS.  Patient is now chest pain free and hemodynamically stable.  Initial ECG without signs of ischemia or infarction.  Patient started on aspirin and heparin drip. --Arrange for coronary angiography upcoming Monday if patient remains stable. --Arrange for echocardiogram.   --Continue aspirin and heparin drip. --Check lipid panel and start atorvastatin 80 mg daily. --Start metoprolol tartrate 25 mg daily.    Hypertension: Primary hypertension with noted stage IIIa chronic kidney disease. Blood pressure at this time is stable.  Currently takes losartan 25 mg daily. --Continue home losartan.    Risk Assessment/Risk Scores:    TIMI Risk Score for Unstable Angina or Non-ST Elevation MI:   The patient's TIMI risk score is 3, which indicates a 13% risk of all cause mortality, new or recurrent myocardial infarction or need for urgent revascularization in the next 14 days.      Code  Status: Full Code  Severity of Illness: The  appropriate patient status for this patient is INPATIENT. Inpatient status is judged to be reasonable and necessary in order to provide the required intensity of service to ensure the patient's safety. The patient's presenting symptoms, physical exam findings, and initial radiographic and laboratory data in the context of their chronic comorbidities is felt to place them at high risk for further clinical deterioration. Furthermore, it is not anticipated that the patient will be medically stable for discharge from the hospital within 2 midnights of admission.   * I certify that at the point of admission it is my clinical judgment that the patient will require inpatient hospital care spanning beyond 2 midnights from the point of admission due to high intensity of service, high risk for further deterioration and high frequency of surveillance required.*   For questions or updates, please contact [facility] Please consult [email or web address] for contact info under     Signed, Judie Grieve, MD  02/01/2023 1:40 AM

## 2023-02-01 NOTE — Progress Notes (Signed)
*  PRELIMINARY RESULTS* Echocardiogram 2D Echocardiogram has been performed.  Stacey Drain 02/01/2023, 3:16 PM

## 2023-02-01 NOTE — Plan of Care (Signed)

## 2023-02-01 NOTE — Progress Notes (Signed)
PHARMACY - ANTICOAGULATION CONSULT NOTE  Pharmacy Consult for heparin Indication: chest pain/ACS  Allergies  Allergen Reactions   Symbicort [Budesonide-Formoterol Fumarate] Other (See Comments)    Allergic reaction jittery    Patient Measurements: Height: 6' (182.9 cm) Weight: 96.1 kg (211 lb 12.8 oz) IBW/kg (Calculated) : 77.6 Heparin Dosing Weight: 97kg  Vital Signs: Temp: 97.9 F (36.6 C) (01/19 0401) Temp Source: Oral (01/19 0401) BP: 101/72 (01/19 0401) Pulse Rate: 65 (01/19 0401)  Labs: Recent Labs    01/31/23 1132 01/31/23 1448 01/31/23 1752 01/31/23 2054 02/01/23 0241  HGB 16.1  --   --   --  14.3  HCT 47.1  --   --   --  40.8  PLT 191  --   --   --  174  HEPARINUNFRC  --   --   --  0.39 0.42  CREATININE 1.34*  --   --   --  1.22  TROPONINIHS 1,583* 1,743* 1,762* 2,166*  --     Estimated Creatinine Clearance: 70.6 mL/min (by C-G formula based on SCr of 1.22 mg/dL).   Medical History: Past Medical History:  Diagnosis Date   Arthritis    Cancer (HCC)    basal cell on nose   COPD (chronic obstructive pulmonary disease) (HCC)    Depression    in the past   Peripheral vascular disease (HCC)    blood clot in left leg   Pneumonia     Assessment: 68 year old male presented to [facility] with chest pain for a week, now with elevated cardiac enzymes. Pharmacy consulted to dose heparin. Patient transferred to [facility] 1/18 for possible cath.  Confirmatory heparin level 0.42 with heparin infusion at 1400 units/hr (therapeutic). Hgb 14.3, Plt 174, stable WNL. Heparin briefly stopped for ~ 5 min per RN overnight but should not have affected level. No signs/symptoms of bleeding reported.   Goal of Therapy:  Heparin level 0.3-0.7 units/ml Monitor platelets by anticoagulation protocol: Yes   Plan:  Continue heparin infusion at 1400 units/hr Daily heparin level Monitor CBC and for signs/symptoms of bleeding F/u cath and long term anticoagulation plans  Stephenie Acres, PharmD PGY1 Pharmacy Resident 02/01/2023 7:23 AM

## 2023-02-01 NOTE — Progress Notes (Signed)
Progress Note  Patient Name: James Harris Date of Encounter: 02/01/2023  Primary Cardiologist: None   Subjective   Feels well currently.  No recurrence of chest pain since admission.  Inpatient Medications    Scheduled Meds:  [START ON 02/02/2023] aspirin EC  81 mg Oral Daily   atorvastatin  40 mg Oral Daily   cholecalciferol  10,000 Units Oral Daily   escitalopram  10 mg Oral Daily   losartan  25 mg Oral Daily   metoprolol tartrate  25 mg Oral BID   pantoprazole  40 mg Oral Daily   Continuous Infusions:  heparin 1,400 Units/hr (02/01/23 0514)   PRN Meds: acetaminophen, albuterol, nitroGLYCERIN, ondansetron (ZOFRAN) IV   Vital Signs    Vitals:   01/31/23 2356 02/01/23 0019 02/01/23 0401 02/01/23 0728  BP: 137/74  101/72 (!) 103/54  Pulse: 63  65 (!) 54  Resp: 18  17 20   Temp: 97.9 F (36.6 C)  97.9 F (36.6 C) 98.8 F (37.1 C)  TempSrc: Oral  Oral Oral  SpO2: 95%  97% 95%  Weight:  96.1 kg    Height:        Intake/Output Summary (Last 24 hours) at 02/01/2023 0935 Last data filed at 02/01/2023 0935 Gross per 24 hour  Intake 857.74 ml  Output --  Net 857.74 ml   Filed Weights   01/31/23 1123 02/01/23 0019  Weight: 97.5 kg 96.1 kg    Telemetry    Sinus rhythm- Personally Reviewed  ECG    No evidence of acute MI  - Personally Reviewed  Physical Exam   GEN: No acute distress.   Neck: No JVD Cardiac: RRR, no murmurs, rubs, or gallops.  Respiratory: Clear to auscultation bilaterally. GI: Soft, nontender, non-distended  MS: No edema; No deformity. Neuro:  Nonfocal  Psych: Normal affect   Labs    Chemistry Recent Labs  Lab 01/31/23 1132 02/01/23 0241  NA 137 133*  K 4.3 3.8  CL 104 102  CO2 27 23  GLUCOSE 124* 90  BUN 16 14  CREATININE 1.34* 1.22  CALCIUM 9.1 8.5*  GFRNONAA 58* >60  ANIONGAP 6 8     Hematology Recent Labs  Lab 01/31/23 1132 02/01/23 0241  WBC 5.5 6.1  RBC 5.08 4.49  HGB 16.1 14.3  HCT 47.1 40.8  MCV  92.7 90.9  MCH 31.7 31.8  MCHC 34.2 35.0  RDW 12.7 12.8  PLT 191 174    Cardiac EnzymesNo results for input(s): "TROPONINI" in the last 168 hours. No results for input(s): "TROPIPOC" in the last 168 hours.   BNPNo results for input(s): "BNP", "PROBNP" in the last 168 hours.   DDimer  Recent Labs  Lab 01/31/23 1132  DDIMER 0.37     Summary of Pertinent studies    TTE: pending  Cardiac cath: Plan for cath tomorrow  Imaging: CXR 1/19 ok  Labs: reviewed  Patient Profile     68 y.o. male with a significant past medical history of hypertension, previous cigarette use, stage IIIa chronic kidney disease, probable reactive airway disease, and prior deep vein thrombosis who is being seen for the evaluation of chest pain/NSTEMI-ACS.   Assessment & Plan    Chest pain/NSTEMI-ACS Most significant chest pain occurred last Monday night but has had recurrent episodes since TnI > 2k Chest pain free currently Arrange for coronary angiogram tomorrow; NPO after midnight Echocardiogram pending Continue aspirin, heparin drip Continue atorvastatin 80  HTN BP controlled Continue home losartan  CKD III Cr 1.22 today    For questions or updates, please contact [facility] Please consult [email or web address] for contact info under Cardiology/STEMI.      Signed, Maurice Small, MD 02/01/2023, 9:35 AM

## 2023-02-02 ENCOUNTER — Encounter (HOSPITAL_COMMUNITY)
Admission: EM | Disposition: A | Discharge status: Home / Self Care | Payer: Self-pay | Source: Other Acute Inpatient Hospital | Attending: Internal Medicine

## 2023-02-02 DIAGNOSIS — I2510 Atherosclerotic heart disease of native coronary artery without angina pectoris: Secondary | ICD-10-CM

## 2023-02-02 DIAGNOSIS — I214 Non-ST elevation (NSTEMI) myocardial infarction: Secondary | ICD-10-CM | POA: Diagnosis not present

## 2023-02-02 HISTORY — PX: LEFT HEART CATH AND CORONARY ANGIOGRAPHY: CATH118249

## 2023-02-02 HISTORY — PX: CORONARY PRESSURE/FFR STUDY: CATH118243

## 2023-02-02 HISTORY — PX: CORONARY STENT INTERVENTION: CATH118234

## 2023-02-02 LAB — CBC
HCT: 42.1 % (ref 39.0–52.0)
Hemoglobin: 14.5 g/dL (ref 13.0–17.0)
MCH: 31.3 pg (ref 26.0–34.0)
MCHC: 34.4 g/dL (ref 30.0–36.0)
MCV: 90.9 fL (ref 80.0–100.0)
Platelets: 187 10*3/uL (ref 150–400)
RBC: 4.63 MIL/uL (ref 4.22–5.81)
RDW: 12.7 % (ref 11.5–15.5)
WBC: 5.7 10*3/uL (ref 4.0–10.5)
nRBC: 0 % (ref 0.0–0.2)

## 2023-02-02 LAB — POCT ACTIVATED CLOTTING TIME
Activated Clotting Time: 285 s
Activated Clotting Time: 308 s
Activated Clotting Time: 279 s
Activated Clotting Time: 331 s
Activated Clotting Time: 440 s

## 2023-02-02 LAB — HEPARIN LEVEL (UNFRACTIONATED): Heparin Unfractionated: 0.54 [IU]/mL (ref 0.30–0.70)

## 2023-02-02 SURGERY — LEFT HEART CATH AND CORONARY ANGIOGRAPHY
Anesthesia: LOCAL

## 2023-02-02 MED ORDER — VERAPAMIL HCL 2.5 MG/ML IV SOLN
INTRAVENOUS | Status: AC
  Filled 2023-02-02: qty 2

## 2023-02-02 MED ORDER — SODIUM CHLORIDE 0.9% FLUSH: 3.0000 mL | Freq: Two times a day (BID) | INTRAVENOUS | Status: DC

## 2023-02-02 MED ORDER — ENOXAPARIN SODIUM 40 MG/0.4ML IJ SOSY
40.0000 mg | PREFILLED_SYRINGE | INTRAMUSCULAR | Status: DC
  Filled 2023-02-02: qty 0.4

## 2023-02-02 MED ORDER — FENTANYL CITRATE (PF) 100 MCG/2ML IJ SOLN
INTRAMUSCULAR | Status: AC
  Filled 2023-02-02: qty 2

## 2023-02-02 MED ORDER — HEPARIN (PORCINE) IN NACL 1000-0.9 UT/500ML-% IV SOLN
INTRAVENOUS | Status: DC | PRN
  Administered 2023-02-02 (×2): 500 mL

## 2023-02-02 MED ORDER — PRASUGREL HCL 10 MG PO TABS
ORAL_TABLET | ORAL | Status: DC | PRN
  Administered 2023-02-02: 60 mg via ORAL

## 2023-02-02 MED ORDER — HYDRALAZINE HCL 20 MG/ML IJ SOLN: 10.0000 mg | INTRAMUSCULAR | Status: AC | PRN

## 2023-02-02 MED ORDER — VERAPAMIL HCL 2.5 MG/ML IV SOLN
INTRAVENOUS | Status: DC | PRN
  Administered 2023-02-02: 10 mL via INTRA_ARTERIAL

## 2023-02-02 MED ORDER — LIDOCAINE HCL (PF) 1 % IJ SOLN
INTRAMUSCULAR | Status: AC
  Filled 2023-02-02: qty 30

## 2023-02-02 MED ORDER — TIROFIBAN HCL IN NACL 5-0.9 MG/100ML-% IV SOLN
0.1500 ug/kg/min | INTRAVENOUS | Status: AC
  Administered 2023-02-02: 0.15 ug/kg/min via INTRAVENOUS
  Filled 2023-02-02: qty 100

## 2023-02-02 MED ORDER — FENTANYL CITRATE (PF) 100 MCG/2ML IJ SOLN
INTRAMUSCULAR | Status: DC | PRN
  Administered 2023-02-02 (×3): 25 ug via INTRAVENOUS

## 2023-02-02 MED ORDER — HEPARIN SODIUM (PORCINE) 1000 UNIT/ML IJ SOLN
INTRAMUSCULAR | Status: DC | PRN
  Administered 2023-02-02: 2000 [IU] via INTRAVENOUS
  Administered 2023-02-02 (×2): 5000 [IU] via INTRAVENOUS
  Administered 2023-02-02: 3000 [IU] via INTRAVENOUS

## 2023-02-02 MED ORDER — PRASUGREL HCL 10 MG PO TABS
10.0000 mg | ORAL_TABLET | Freq: Every day | ORAL | Status: DC
  Administered 2023-02-03: 10 mg via ORAL
  Filled 2023-02-02: qty 1

## 2023-02-02 MED ORDER — IOHEXOL 350 MG/ML SOLN
INTRAVENOUS | Status: DC | PRN
  Administered 2023-02-02: 130 mL

## 2023-02-02 MED ORDER — MIDAZOLAM HCL 2 MG/2ML IJ SOLN
INTRAMUSCULAR | Status: AC
  Filled 2023-02-02: qty 2

## 2023-02-02 MED ORDER — LIDOCAINE HCL (PF) 1 % IJ SOLN
INTRAMUSCULAR | Status: DC | PRN
  Administered 2023-02-02: 2 mL

## 2023-02-02 MED ORDER — ADENOSINE (DIAGNOSTIC) FOR INTRACORONARY USE
INTRAVENOUS | Status: DC | PRN
  Administered 2023-02-02 (×2): 60 ug via INTRACORONARY
  Administered 2023-02-02: 30 ug via INTRACORONARY
  Administered 2023-02-02 (×3): 60 ug via INTRACORONARY

## 2023-02-02 MED ORDER — MIDAZOLAM HCL 2 MG/2ML IJ SOLN
INTRAMUSCULAR | Status: DC | PRN
  Administered 2023-02-02 (×3): 1 mg via INTRAVENOUS

## 2023-02-02 MED ORDER — SODIUM CHLORIDE 0.9% FLUSH: 3.0000 mL | INTRAVENOUS | Status: DC | PRN

## 2023-02-02 MED ORDER — ADENOSINE 6 MG/2ML IV SOLN
INTRAVENOUS | Status: AC
  Filled 2023-02-02: qty 2

## 2023-02-02 MED ORDER — TIROFIBAN HCL IN NACL 5-0.9 MG/100ML-% IV SOLN
0.1500 ug/kg/min | INTRAVENOUS | Status: DC
  Filled 2023-02-02: qty 100

## 2023-02-02 MED ORDER — NITROGLYCERIN 1 MG/10 ML FOR IR/CATH LAB
INTRA_ARTERIAL | Status: DC | PRN
  Administered 2023-02-02: 100 ug via INTRACORONARY
  Administered 2023-02-02 (×2): 200 ug via INTRACORONARY
  Administered 2023-02-02: 100 ug via INTRACORONARY
  Administered 2023-02-02: 200 ug via INTRACORONARY

## 2023-02-02 MED ORDER — SODIUM CHLORIDE 0.9 % IV SOLN: 250.0000 mL | INTRAVENOUS | Status: DC | PRN

## 2023-02-02 MED ORDER — HEPARIN SODIUM (PORCINE) 1000 UNIT/ML IJ SOLN
INTRAMUSCULAR | Status: AC
Start: 2023-02-02 — End: ?
  Filled 2023-02-02: qty 10

## 2023-02-02 MED ORDER — TIROFIBAN (AGGRASTAT) BOLUS VIA INFUSION
INTRAVENOUS | Status: DC | PRN
  Administered 2023-02-02: 2395 ug via INTRAVENOUS

## 2023-02-02 MED ORDER — PRASUGREL HCL 10 MG PO TABS
ORAL_TABLET | ORAL | Status: AC
Start: 2023-02-02 — End: ?
  Filled 2023-02-02: qty 6

## 2023-02-02 MED ORDER — MIDAZOLAM HCL 2 MG/2ML IJ SOLN
INTRAMUSCULAR | Status: AC
Start: 2023-02-02 — End: ?
  Filled 2023-02-02: qty 2

## 2023-02-02 MED ORDER — SODIUM CHLORIDE 0.9 % IV SOLN: INTRAVENOUS | Status: AC

## 2023-02-02 MED ORDER — NITROGLYCERIN 1 MG/10 ML FOR IR/CATH LAB
INTRA_ARTERIAL | Status: AC
  Filled 2023-02-02: qty 10

## 2023-02-02 MED ORDER — TIROFIBAN HCL IN NACL 5-0.9 MG/100ML-% IV SOLN
INTRAVENOUS | Status: DC | PRN
  Administered 2023-02-02: .15 ug/kg/min via INTRAVENOUS

## 2023-02-02 MED ORDER — SODIUM CHLORIDE 0.9 % IV SOLN
INTRAVENOUS | Status: DC | PRN
  Administered 2023-02-02: 250 mL via INTRAVENOUS

## 2023-02-02 MED ORDER — TIROFIBAN HCL IN NACL 5-0.9 MG/100ML-% IV SOLN
INTRAVENOUS | Status: AC
  Filled 2023-02-02: qty 100

## 2023-02-02 SURGICAL SUPPLY — 25 items
BALLN EMERGE MR 3.0X15 (BALLOONS) ×1
BALLN ~~LOC~~ EMERGE MR 4.0X12 (BALLOONS) ×1
BALLN ~~LOC~~ EMERGE MR 4.0X15 (BALLOONS) ×1
BALLN ~~LOC~~ EMERGE MR 4.0X8 (BALLOONS) ×1
BALLOON EMERGE MR 3.0X15 (BALLOONS) IMPLANT
BALLOON ~~LOC~~ EMERGE MR 4.0X12 (BALLOONS) IMPLANT
BALLOON ~~LOC~~ EMERGE MR 4.0X15 (BALLOONS) IMPLANT
BALLOON ~~LOC~~ EMERGE MR 4.0X8 (BALLOONS) IMPLANT
CATH 5FR JL3.5 JR4 ANG PIG MP (CATHETERS) IMPLANT
CATH LAUNCHER 6FR AL.75 (CATHETERS) IMPLANT
CATH LAUNCHER 6FR EBU3.5 (CATHETERS) IMPLANT
DEVICE RAD COMP TR BAND LRG (VASCULAR PRODUCTS) IMPLANT
GLIDESHEATH SLEND SS 6F .021 (SHEATH) IMPLANT
GUIDEWIRE INQWIRE 1.5J.035X260 (WIRE) IMPLANT
GUIDEWIRE PRESSURE X 175 (WIRE) IMPLANT
INQWIRE 1.5J .035X260CM (WIRE) ×1
KIT ENCORE 26 ADVANTAGE (KITS) IMPLANT
KIT ESSENTIALS PG (KITS) IMPLANT
PACK CARDIAC CATHETERIZATION (CUSTOM PROCEDURE TRAY) ×2 IMPLANT
SET ATX-X65L (MISCELLANEOUS) IMPLANT
STENT SYNERGY XD 3.50X38 (Permanent Stent) IMPLANT
STENT SYNERGY XD 4.0X8 (Permanent Stent) IMPLANT
SYNERGY XD 3.50X38 (Permanent Stent) ×1 IMPLANT
SYNERGY XD 4.0X8 (Permanent Stent) ×1 IMPLANT
WIRE RUNTHROUGH .014X180CM (WIRE) IMPLANT

## 2023-02-02 NOTE — H&P (View-Only) (Signed)
Cardiology Progress Note  Patient ID: James Harris MRN: [id number] DOB: 16-Jun-1955 Date of Encounter: 02/02/2023 Primary Cardiologist: None  Subjective   Chief Complaint: None.   HPI: NPO for LHC. No CP overnight.   ROS:  All other ROS reviewed and negative. Pertinent positives noted in the HPI.     Telemetry  Overnight telemetry shows sinus rhythm 60, which I personally reviewed.   ECG  The most recent ECG shows sinus rhythm heart rate 62, PACs noted, which I personally reviewed.   Physical Exam   Vitals:   02/01/23 1940 02/02/23 0034 02/02/23 0620 02/02/23 0730  BP: 115/68 135/81 123/69 118/63  Pulse: 63 66 (!) 50 (!) 56  Resp: (!) 22 19 20 19   Temp: 99 F (37.2 C) 99 F (37.2 C) 99 F (37.2 C) 98 F (36.7 C)  TempSrc: Oral Oral Oral Oral  SpO2: 95% 96% 95% 95%  Weight:   95.8 kg   Height:        Intake/Output Summary (Last 24 hours) at 02/02/2023 0904 Last data filed at 02/02/2023 0500 Gross per 24 hour  Intake 1410.63 ml  Output 825 ml  Net 585.63 ml       02/02/2023    6:20 AM 02/01/2023   12:19 AM 01/31/2023   11:23 AM  Last 3 Weights  Weight (lbs) 211 lb 1.6 oz 211 lb 12.8 oz 215 lb  Weight (kg) 95.754 kg 96.072 kg 97.523 kg    Body mass index is 28.63 kg/m.  General: Well nourished, well developed, in no acute distress Head: Atraumatic, normal size  Eyes: PEERLA, EOMI  Neck: Supple, no JVD Endocrine: No thryomegaly Cardiac: Normal S1, S2; RRR; no murmurs, rubs, or gallops Lungs: Clear to auscultation bilaterally, no wheezing, rhonchi or rales  Abd: Soft, nontender, no hepatomegaly  Ext: No edema, pulses 2+ Musculoskeletal: No deformities, BUE and BLE strength normal and equal Skin: Warm and dry, no rashes   Neuro: Alert and oriented to person, place, time, and situation, CNII-XII grossly intact, no focal deficits  Psych: Normal mood and affect   Cardiac Studies  TTE 02/01/2023  1. Left ventricular ejection fraction, by estimation, is 55 to  60%. The  left ventricle has normal function. The left ventricle has no regional  wall motion abnormalities. Left ventricular diastolic parameters were  normal.   2. Right ventricular systolic function is normal. The right ventricular  size is mildly enlarged. There is normal pulmonary artery systolic  pressure. The estimated right ventricular systolic pressure is 30.1 mmHg.   3. Left atrial size was moderately dilated.   4. Right atrial size was mildly dilated.   5. The mitral valve is normal in structure. Trivial mitral valve  regurgitation. No evidence of mitral stenosis.   6. The aortic valve is tricuspid. Aortic valve regurgitation is mild. No  aortic stenosis is present.   7. Aortic dilatation noted. There is moderate dilatation of the aortic  root, measuring 47 mm. There is moderate dilatation of the ascending  aorta, measuring 48 mm.   8. The inferior vena cava is dilated in size with >50% respiratory  variability, suggesting right atrial pressure of 8 mmHg.   Patient Profile  James Harris is a 68 y.o. male with hypertension, CKD stage IIIa, prior DVT admitted on 02/01/2023 for non-STEMI.  Assessment & Plan   # Non-STEMI -Elevated cardiac enzymes.  EKG shows no ST elevation.  Echo was normal.  Plan for left heart catheterization today.  Risk  and benefits explained.  He is willing to proceed. -Continue aspirin 81 mg daily and heparin drip. -Continue Lipitor 40 mg daily. -On metoprolol tartrate 25 mg twice daily. -Add on TSH and A1c tomorrow.  Informed Consent   Shared Decision Making/Informed Consent The risks [stroke (1 in 1000), death (1 in 1000), kidney failure [usually temporary] (1 in 500), bleeding (1 in 200), allergic reaction [possibly serious] (1 in 200)], benefits (diagnostic support and management of coronary artery disease) and alternatives of a cardiac catheterization were discussed in detail with James Harris and he is willing to proceed.     #  HTN -Metoprolol tartrate 25 mg twice daily.  Losartan 25 mg daily.  Can likely titrate off beta-blocker after cath.  # Hyperlipidemia -Lipitor 40 mg daily.  # CKD 3A -Kidney function quite stable.  # DVT -Remote.  No longer on AC.  FEN -Intravenous fluids precath -Code: Full -Diet: N.p.o. for left heart cath -DVT PPx: Heparin drip -Disposition pending left heart catheterization today       For questions or updates, please contact [facility] Please consult [email or web address] for contact info under        Signed, Gerri Spore T. Flora Lipps, MD, [facility] [employer]  [facility]  02/02/2023 9:04 AM

## 2023-02-02 NOTE — Interval H&P Note (Signed)
History and Physical Interval Note:  02/02/2023 11:16 AM  James Harris  has presented today for surgery, with the diagnosis of NSTEMI.  The various methods of treatment have been discussed with the patient and family. After consideration of risks, benefits and other options for treatment, the patient has consented to  Procedure(s): LEFT HEART CATH AND CORONARY ANGIOGRAPHY (N/A) as a surgical intervention.  The patient's history has been reviewed, patient examined, no change in status, stable for surgery.  I have reviewed the patient's chart and labs.  Questions were answered to the patient's satisfaction.    Cath Lab Visit (complete for each Cath Lab visit)  Clinical Evaluation Leading to the Procedure:   ACS: Yes.    Non-ACS:  N/A  Shabreka Coulon

## 2023-02-02 NOTE — Progress Notes (Signed)
PHARMACY - ANTICOAGULATION CONSULT NOTE  Pharmacy Consult for heparin Indication: chest pain/ACS  Allergies  Allergen Reactions   Symbicort [Budesonide-Formoterol Fumarate] Other (See Comments)    Allergic reaction jittery    Patient Measurements: Height: 6' (182.9 cm) Weight: 95.8 kg (211 lb 1.6 oz) IBW/kg (Calculated) : 77.6 Heparin Dosing Weight: 97kg  Vital Signs: Temp: 99 F (37.2 C) (01/20 0620) Temp Source: Oral (01/20 0620) BP: 123/69 (01/20 0620) Pulse Rate: 50 (01/20 0620)  Labs: Recent Labs    01/31/23 1132 01/31/23 1448 01/31/23 1752 01/31/23 2054 02/01/23 0241 02/02/23 0259  HGB 16.1  --   --   --  14.3 14.5  HCT 47.1  --   --   --  40.8 42.1  PLT 191  --   --   --  174 187  HEPARINUNFRC  --   --   --  0.39 0.42 0.54  CREATININE 1.34*  --   --   --  1.22  --   TROPONINIHS 1,583* 1,743* 1,762* 2,166*  --   --     Estimated Creatinine Clearance: 70.6 mL/min (by C-G formula based on SCr of 1.22 mg/dL).   Medical History: Past Medical History:  Diagnosis Date   Arthritis    Cancer (HCC)    basal cell on nose   COPD (chronic obstructive pulmonary disease) (HCC)    Depression    in the past   Peripheral vascular disease (HCC)    blood clot in left leg   Pneumonia     Assessment: 68 year old male presented to [facility] with chest pain for a week, now with elevated cardiac enzymes. Pharmacy consulted to dose heparin. Patient transferred to [facility] 1/18 for possible cath.  Confirmatory heparin level 0.42 with heparin infusion at 1400 units/hr (therapeutic). Hgb 14.3, Plt 174, stable WNL. Heparin briefly stopped for ~ 5 min per RN overnight but should not have affected level. No signs/symptoms of bleeding reported.   1/20 AM update: HL 0.54 No signs of bleeding or issues reported with the infusion  Goal of Therapy:  Heparin level 0.3-0.7 units/ml Monitor platelets by anticoagulation protocol: Yes   Plan:  Continue heparin infusion at 1400  units/hr Daily heparin level Monitor CBC and for signs/symptoms of bleeding F/u cath and long term anticoagulation plans  Greta Doom BS, PharmD, BCPS Clinical Pharmacist 02/02/2023 7:01 AM  Contact: [phone] after 3 PM  "Be curious, not judgmental..." -Debbora Dus

## 2023-02-02 NOTE — Plan of Care (Signed)

## 2023-02-02 NOTE — Progress Notes (Signed)
Cardiology Progress Note  Patient ID: James Harris MRN: [id number] DOB: 16-Jun-1955 Date of Encounter: 02/02/2023 Primary Cardiologist: None  Subjective   Chief Complaint: None.   HPI: NPO for LHC. No CP overnight.   ROS:  All other ROS reviewed and negative. Pertinent positives noted in the HPI.     Telemetry  Overnight telemetry shows sinus rhythm 60, which I personally reviewed.   ECG  The most recent ECG shows sinus rhythm heart rate 62, PACs noted, which I personally reviewed.   Physical Exam   Vitals:   02/01/23 1940 02/02/23 0034 02/02/23 0620 02/02/23 0730  BP: 115/68 135/81 123/69 118/63  Pulse: 63 66 (!) 50 (!) 56  Resp: (!) 22 19 20 19   Temp: 99 F (37.2 C) 99 F (37.2 C) 99 F (37.2 C) 98 F (36.7 C)  TempSrc: Oral Oral Oral Oral  SpO2: 95% 96% 95% 95%  Weight:   95.8 kg   Height:        Intake/Output Summary (Last 24 hours) at 02/02/2023 0904 Last data filed at 02/02/2023 0500 Gross per 24 hour  Intake 1410.63 ml  Output 825 ml  Net 585.63 ml       02/02/2023    6:20 AM 02/01/2023   12:19 AM 01/31/2023   11:23 AM  Last 3 Weights  Weight (lbs) 211 lb 1.6 oz 211 lb 12.8 oz 215 lb  Weight (kg) 95.754 kg 96.072 kg 97.523 kg    Body mass index is 28.63 kg/m.  General: Well nourished, well developed, in no acute distress Head: Atraumatic, normal size  Eyes: PEERLA, EOMI  Neck: Supple, no JVD Endocrine: No thryomegaly Cardiac: Normal S1, S2; RRR; no murmurs, rubs, or gallops Lungs: Clear to auscultation bilaterally, no wheezing, rhonchi or rales  Abd: Soft, nontender, no hepatomegaly  Ext: No edema, pulses 2+ Musculoskeletal: No deformities, BUE and BLE strength normal and equal Skin: Warm and dry, no rashes   Neuro: Alert and oriented to person, place, time, and situation, CNII-XII grossly intact, no focal deficits  Psych: Normal mood and affect   Cardiac Studies  TTE 02/01/2023  1. Left ventricular ejection fraction, by estimation, is 55 to  60%. The  left ventricle has normal function. The left ventricle has no regional  wall motion abnormalities. Left ventricular diastolic parameters were  normal.   2. Right ventricular systolic function is normal. The right ventricular  size is mildly enlarged. There is normal pulmonary artery systolic  pressure. The estimated right ventricular systolic pressure is 30.1 mmHg.   3. Left atrial size was moderately dilated.   4. Right atrial size was mildly dilated.   5. The mitral valve is normal in structure. Trivial mitral valve  regurgitation. No evidence of mitral stenosis.   6. The aortic valve is tricuspid. Aortic valve regurgitation is mild. No  aortic stenosis is present.   7. Aortic dilatation noted. There is moderate dilatation of the aortic  root, measuring 47 mm. There is moderate dilatation of the ascending  aorta, measuring 48 mm.   8. The inferior vena cava is dilated in size with >50% respiratory  variability, suggesting right atrial pressure of 8 mmHg.   Patient Profile  James Harris is a 68 y.o. male with hypertension, CKD stage IIIa, prior DVT admitted on 02/01/2023 for non-STEMI.  Assessment & Plan   # Non-STEMI -Elevated cardiac enzymes.  EKG shows no ST elevation.  Echo was normal.  Plan for left heart catheterization today.  Risk  and benefits explained.  He is willing to proceed. -Continue aspirin 81 mg daily and heparin drip. -Continue Lipitor 40 mg daily. -On metoprolol tartrate 25 mg twice daily. -Add on TSH and A1c tomorrow.  Informed Consent   Shared Decision Making/Informed Consent The risks [stroke (1 in 1000), death (1 in 1000), kidney failure [usually temporary] (1 in 500), bleeding (1 in 200), allergic reaction [possibly serious] (1 in 200)], benefits (diagnostic support and management of coronary artery disease) and alternatives of a cardiac catheterization were discussed in detail with James Harris and he is willing to proceed.     #  HTN -Metoprolol tartrate 25 mg twice daily.  Losartan 25 mg daily.  Can likely titrate off beta-blocker after cath.  # Hyperlipidemia -Lipitor 40 mg daily.  # CKD 3A -Kidney function quite stable.  # DVT -Remote.  No longer on AC.  FEN -Intravenous fluids precath -Code: Full -Diet: N.p.o. for left heart cath -DVT PPx: Heparin drip -Disposition pending left heart catheterization today       For questions or updates, please contact [facility] Please consult [email or web address] for contact info under        Signed, Gerri Spore T. Flora Lipps, MD, [facility] [employer]  [facility]  02/02/2023 9:04 AM

## 2023-02-03 ENCOUNTER — Telehealth: Payer: Self-pay | Admitting: Student

## 2023-02-03 ENCOUNTER — Other Ambulatory Visit (HOSPITAL_COMMUNITY): Payer: Self-pay

## 2023-02-03 ENCOUNTER — Encounter (HOSPITAL_COMMUNITY): Payer: Self-pay | Admitting: Internal Medicine

## 2023-02-03 DIAGNOSIS — I214 Non-ST elevation (NSTEMI) myocardial infarction: Secondary | ICD-10-CM | POA: Diagnosis not present

## 2023-02-03 DIAGNOSIS — I2510 Atherosclerotic heart disease of native coronary artery without angina pectoris: Secondary | ICD-10-CM | POA: Insufficient documentation

## 2023-02-03 DIAGNOSIS — E785 Hyperlipidemia, unspecified: Secondary | ICD-10-CM | POA: Insufficient documentation

## 2023-02-03 DIAGNOSIS — I7121 Aneurysm of the ascending aorta, without rupture: Secondary | ICD-10-CM

## 2023-02-03 DIAGNOSIS — N183 Chronic kidney disease, stage 3 unspecified: Secondary | ICD-10-CM | POA: Insufficient documentation

## 2023-02-03 LAB — LIPOPROTEIN A (LPA): Lipoprotein (a): 175.8 nmol/L — ABNORMAL HIGH (ref <75.0)

## 2023-02-03 LAB — HEPATIC FUNCTION PANEL
ALT: 15 U/L (ref 0–44)
AST: 21 U/L (ref 15–41)
Albumin: 3.1 g/dL — ABNORMAL LOW (ref 3.5–5.0)
Alkaline Phosphatase: 78 U/L (ref 38–126)
Bilirubin, Direct: <0.1 mg/dL (ref 0.0–0.2)
Total Bilirubin: 0.6 mg/dL (ref 0.0–1.2)
Total Protein: 5.6 g/dL — ABNORMAL LOW (ref 6.5–8.1)

## 2023-02-03 LAB — BASIC METABOLIC PANEL
Anion gap: 4 — ABNORMAL LOW (ref 5–15)
BUN: 15 mg/dL (ref 8–23)
CO2: 25 mmol/L (ref 22–32)
Calcium: 8.4 mg/dL — ABNORMAL LOW (ref 8.9–10.3)
Chloride: 105 mmol/L (ref 98–111)
Creatinine, Ser: 1.38 mg/dL — ABNORMAL HIGH (ref 0.61–1.24)
GFR, Estimated: 56 mL/min — ABNORMAL LOW (ref >60)
Glucose, Bld: 103 mg/dL — ABNORMAL HIGH (ref 70–99)
Potassium: 4.1 mmol/L (ref 3.5–5.1)
Sodium: 134 mmol/L — ABNORMAL LOW (ref 135–145)

## 2023-02-03 LAB — GLUCOSE, CAPILLARY: Glucose-Capillary: 102 mg/dL — ABNORMAL HIGH (ref 70–99)

## 2023-02-03 LAB — HEMOGLOBIN A1C
Hgb A1c MFr Bld: 5.5 % (ref 4.8–5.6)
Mean Plasma Glucose: 111.15 mg/dL

## 2023-02-03 LAB — TSH: TSH: 0.802 u[IU]/mL (ref 0.350–4.500)

## 2023-02-03 MED ORDER — NITROGLYCERIN 0.4 MG SL SUBL
0.4000 mg | SUBLINGUAL_TABLET | SUBLINGUAL | 2 refills | Status: DC | PRN
  Filled 2023-02-03: qty 25, 8d supply, fill #0

## 2023-02-03 MED ORDER — ASPIRIN 81 MG PO TBEC
81.0000 mg | DELAYED_RELEASE_TABLET | Freq: Every day | ORAL | 3 refills | Status: DC
  Filled 2023-02-03: qty 90, 90d supply, fill #0

## 2023-02-03 MED ORDER — PRASUGREL HCL 10 MG PO TABS
10.0000 mg | ORAL_TABLET | Freq: Every day | ORAL | 11 refills | Status: DC
  Filled 2023-02-03: qty 30, 30d supply, fill #0

## 2023-02-03 MED ORDER — METOPROLOL TARTRATE 25 MG PO TABS
25.0000 mg | ORAL_TABLET | Freq: Two times a day (BID) | ORAL | 2 refills | Status: DC
  Filled 2023-02-03: qty 60, 30d supply, fill #0

## 2023-02-03 MED ORDER — ATORVASTATIN CALCIUM 40 MG PO TABS
40.0000 mg | ORAL_TABLET | Freq: Every day | ORAL | 2 refills | Status: DC
  Filled 2023-02-03: qty 30, 30d supply, fill #0

## 2023-02-03 NOTE — Progress Notes (Signed)
Plan for discharge today.  He will be discharged on aspirin and prasugrel.  He will also be discharged on high intensity statin.  Full discharge summary to follow.  Gerri Spore T. Flora Lipps, MD, [facility]  [facility]  [address] [place], [place] [address] [phone]  8:39 AM

## 2023-02-03 NOTE — Discharge Summary (Signed)
Discharge Summary    Patient ID: James Harris MRN: [id number]; DOB: 1955/05/15  Admit date: 01/31/2023 Discharge date: 02/03/2023  PCP:  Benita Stabile, MD   [facility] Providers Cardiologist:  None        Discharge Diagnoses    Principal Problem:   NSTEMI (non-ST elevated myocardial infarction) [facility]) Active Problems:   CAD (coronary artery disease)   Thoracic ascending aortic aneurysm (HCC)   Hypertension   Hyperlipidemia   Chronic kidney disease (CKD), stage III (moderate) (HCC)    Diagnostic Studies/Procedures    Echocardiogram 02/01/2023: Impressions: 1. Left ventricular ejection fraction, by estimation, is 55 to 60%. The  left ventricle has normal function. The left ventricle has no regional  wall motion abnormalities. Left ventricular diastolic parameters were  normal.   2. Right ventricular systolic function is normal. The right ventricular  size is mildly enlarged. There is normal pulmonary artery systolic  pressure. The estimated right ventricular systolic pressure is 30.1 mmHg.   3. Left atrial size was moderately dilated.   4. Right atrial size was mildly dilated.   5. The mitral valve is normal in structure. Trivial mitral valve  regurgitation. No evidence of mitral stenosis.   6. The aortic valve is tricuspid. Aortic valve regurgitation is mild. No  aortic stenosis is present.   7. Aortic dilatation noted. There is moderate dilatation of the aortic  root, measuring 47 mm. There is moderate dilatation of the ascending  aorta, measuring 48 mm.   8. The inferior vena cava is dilated in size with >50% respiratory  variability, suggesting right atrial pressure of 8 mmHg.   _____________  Left Cardiac Catheterization 02/02/2023: Conclusions: Multivessel coronary artery disease, as detailed below.  Culprit for the patient's NSTEMI is likely long 95% stenosis of the mid RCA.  There are also moderate to severe but not hemodynamically significant  lesions involving the mid LAD (RFR 0.92) and OM2 (RFR 0.99). Normal left ventricular filling pressure (LVEDP 10 mmHg). Successful PCI to mid/still RCA using overlapping Synergy XD 4.0 x 8 mm (proximal) and 3.5 x 38 mm (distal) drug-eluting stents with 0% residual stenosis and TIMI-3 flow.   Recommendations: Continue tirofiban infusion for 4 hours. Dual antiplatelet therapy with aspirin and prasugrel for at least 12 months. Aggressive secondary prevention of coronary artery disease.  Diagnostic Dominance: Right  Intervention        History of Present Illness     James Harris is a 68 y.o. male with history of COPD, prior DVT, hypertension, CKD stage IIIa, depression, and prior tobacco use who presented to [facility] ED on 01/31/2023 for further evaluation of  intermittent chest pain for the last several days.  He ruled in for a NSTEMI with initial high sensitive troponin of 1,583.  Therefore, he was started on IV heparin and given high-dose aspirin and then transferred to [facility] for further management of NSTEMI.  Hospital Course     Consultants: None   NSTEMI CAD Patient presented with intermittent chest pain over the last several days. High-sensitivity troponin peaked at 2,166. Echo showed LVEF of 55-60% with normal wall motion, mildly enlarged RV with normal function, mild to moderate biatrial enlargement (left > right), and moderate dilatation of the aortic root and ascending aorta measuring 47mm and 48 respectively. LHC showed multivessel CAD with a long 95% stenosis of the mid RCA as well as moderate to severe lesions in the mid LAD and OM2 but these were no  hemodynamically significant by FFR. He underwent successful PCI with overlapping DES x2 to the RCA and was treated with a Tirofiban infusion for 4 hours. He tolerated the procedure well. He was started on DAPT with Aspirin 81mg  daily and Prasugrel 10mg  daily with plans to continue this for at least 12 months. He was  also started on beta-blocker and statin.  Ascending Thoracic Aortic Aneurysm Echo showed moderate dilatation of the aortic root and ascending aorta measuring 47mm and 48 respectively. Will plan to order a chest CTA at outpatient follow-up.  Hypertension BP mostly well controlled during admission.Started on Lopressor 25mg  twice daily in setting of NSTEMI. Continue home Losartan 25mg  daily.   Hyperlipidemia Lipid panel this admission: Total Cholesterol 147, Triglycerides 55, HDL 28, LDL 108. LDL goal <70 given CAD. Started on Lipitor 40mg  daily. Will need repeat lipid panel and LFTs in 6-8 weeks.  CKD Stage III Creatinine stable in the 1.2 to 1.3 range this admission. Can repeat BMET at follow-up visit.  History of DVT Remote history of DVT. No longer on anticoagulation.  Patient seen and examined by Dr. Flora Lipps today and determined to be stable for discharge. Outpatient follow-up arranged. Medications as below.     Did the patient have an acute coronary syndrome (MI, NSTEMI, STEMI, etc) this admission?:  Yes                               AHA/ACC ACS Clinical Performance & Quality Measures: Aspirin prescribed? - Yes ADP Receptor Inhibitor (Plavix/Clopidogrel, Brilinta/Ticagrelor or Effient/Prasugrel) prescribed (includes medically managed patients)? - Yes Beta Blocker prescribed? - Yes High Intensity Statin (Lipitor 40-80mg  or Crestor 20-40mg ) prescribed? - Yes EF assessed during THIS hospitalization? - Yes For EF <40%, was ACEI/ARB prescribed? - Not Applicable (EF >/= 40%) For EF <40%, Aldosterone Antagonist (Spironolactone or Eplerenone) prescribed? - Not Applicable (EF >/= 40%) Cardiac Rehab Phase II ordered (including medically managed patients)? - Yes    The patient will be scheduled for a TOC follow up appointment within 14 days.  A message has been sent to the [facility] and Scheduling Pool at the office where the patient should be seen for follow up.  _____________  Discharge  Vitals Blood pressure 121/65, pulse 67, temperature 97.8 F (36.6 C), temperature source Oral, resp. rate (!) 22, height 6' (1.829 m), weight 95.8 kg, SpO2 96%.  Filed Weights   01/31/23 1123 02/01/23 0019 02/02/23 0620  Weight: 97.5 kg 96.1 kg 95.8 kg    Labs & Radiologic Studies    CBC Recent Labs    02/01/23 0241 02/02/23 0259  WBC 6.1 5.7  HGB 14.3 14.5  HCT 40.8 42.1  MCV 90.9 90.9  PLT 174 187   Basic Metabolic Panel Recent Labs    01/14/70 0241 02/03/23 0203  NA 133* 134*  K 3.8 4.1  CL 102 105  CO2 23 25  GLUCOSE 90 103*  BUN 14 15  CREATININE 1.22 1.38*  CALCIUM 8.5* 8.4*   Liver Function Tests Recent Labs    02/03/23 0203  AST 21  ALT 15  ALKPHOS 78  BILITOT 0.6  PROT 5.6*  ALBUMIN 3.1*   No results for input(s): "LIPASE", "AMYLASE" in the last 72 hours. High Sensitivity Troponin:   Recent Labs  Lab 01/31/23 1132 01/31/23 1448 01/31/23 1752 01/31/23 2054  TROPONINIHS 1,583* 1,743* 1,762* 2,166*    BNP Invalid input(s): "POCBNP" D-Dimer No results for input(s): "DDIMER" in  the last 72 hours.  Hemoglobin A1C Recent Labs    02/03/23 0203  HGBA1C 5.5   Fasting Lipid Panel Recent Labs    02/01/23 0241  CHOL 147  HDL 28*  LDLCALC 108*  TRIG 55  CHOLHDL 5.3   Thyroid Function Tests Recent Labs    02/03/23 0203  TSH 0.802   _____________  CARDIAC CATHETERIZATION Result Date: 02/02/2023 Conclusions: Multivessel coronary artery disease, as detailed below.  Culprit for the patient's NSTEMI is likely long 95% stenosis of the mid RCA.  There are also moderate to severe but not hemodynamically significant lesions involving the mid LAD (RFR 0.92) and OM2 (RFR 0.99). Normal left ventricular filling pressure (LVEDP 10 mmHg). Successful PCI to mid/still RCA using overlapping Synergy XD 4.0 x 8 mm (proximal) and 3.5 x 38 mm (distal) drug-eluting stents with 0% residual stenosis and TIMI-3 flow. Recommendations: Continue tirofiban infusion  for 4 hours. Dual antiplatelet therapy with aspirin and prasugrel for at least 12 months. Aggressive secondary prevention of coronary artery disease. Yvonne Kendall, MD [facility]  ECHOCARDIOGRAM COMPLETE Result Date: 02/01/2023    ECHOCARDIOGRAM REPORT   Patient Name:   James Harris Date of Exam: 02/01/2023 Medical Rec #:  [id number]        Height:       72.0 in Accession #:    [id number]       Weight:       211.8 lb Date of Birth:  [birth year]       BSA:          2.183 m Patient Age:    68 years         BP:           103/54 mmHg Patient Gender: M                HR:           54 bpm. Exam Location:  Inpatient Procedure: 2D Echo, Cardiac Doppler and Color Doppler Indications:    NSTEMI l21.4  History:        Patient has no prior history of Echocardiogram examinations.                 COPD; Risk Factors:Hypertension.  Sonographer:    Celesta Gentile RCS Referring Phys: [id number] KAMAL H HENDERSON IMPRESSIONS  1. Left ventricular ejection fraction, by estimation, is 55 to 60%. The left ventricle has normal function. The left ventricle has no regional wall motion abnormalities. Left ventricular diastolic parameters were normal.  2. Right ventricular systolic function is normal. The right ventricular size is mildly enlarged. There is normal pulmonary artery systolic pressure. The estimated right ventricular systolic pressure is 30.1 mmHg.  3. Left atrial size was moderately dilated.  4. Right atrial size was mildly dilated.  5. The mitral valve is normal in structure. Trivial mitral valve regurgitation. No evidence of mitral stenosis.  6. The aortic valve is tricuspid. Aortic valve regurgitation is mild. No aortic stenosis is present.  7. Aortic dilatation noted. There is moderate dilatation of the aortic root, measuring 47 mm. There is moderate dilatation of the ascending aorta, measuring 48 mm.  8. The inferior vena cava is dilated in size with >50% respiratory variability, suggesting right atrial pressure of 8  mmHg. FINDINGS  Left Ventricle: Left ventricular ejection fraction, by estimation, is 55 to 60%. The left ventricle has normal function. The left ventricle has no regional wall motion abnormalities. The left ventricular internal cavity size was normal  in size. There is  borderline concentric left ventricular hypertrophy. Left ventricular diastolic parameters were normal. Normal left ventricular filling pressure. Right Ventricle: The right ventricular size is mildly enlarged. No increase in right ventricular wall thickness. Right ventricular systolic function is normal. There is normal pulmonary artery systolic pressure. The tricuspid regurgitant velocity is 2.35  m/s, and with an assumed right atrial pressure of 8 mmHg, the estimated right ventricular systolic pressure is 30.1 mmHg. Left Atrium: Left atrial size was moderately dilated. Right Atrium: Right atrial size was mildly dilated. Pericardium: There is no evidence of pericardial effusion. Mitral Valve: The mitral valve is normal in structure. Mild mitral annular calcification. Trivial mitral valve regurgitation, with centrally-directed jet. No evidence of mitral valve stenosis. Tricuspid Valve: The tricuspid valve is normal in structure. Tricuspid valve regurgitation is trivial. Aortic Valve: The aortic valve is tricuspid. Aortic valve regurgitation is mild. Aortic regurgitation PHT measures 634 msec. No aortic stenosis is present. Pulmonic Valve: The pulmonic valve was grossly normal. Pulmonic valve regurgitation is trivial. No evidence of pulmonic stenosis. Aorta: Aortic dilatation noted. There is moderate dilatation of the aortic root, measuring 47 mm. There is moderate dilatation of the ascending aorta, measuring 48 mm. Venous: The inferior vena cava is dilated in size with greater than 50% respiratory variability, suggesting right atrial pressure of 8 mmHg. IAS/Shunts: No atrial level shunt detected by color flow Doppler.  LEFT VENTRICLE PLAX 2D LVIDd:          5.20 cm   Diastology LVIDs:         4.00 cm   LV e' medial:    7.94 cm/s LV PW:         1.10 cm   LV E/e' medial:  9.8 LV IVS:        1.40 cm   LV e' lateral:   8.49 cm/s LVOT diam:     2.20 cm   LV E/e' lateral: 9.1 LV SV:         91 LV SV Index:   42 LVOT Area:     3.80 cm  RIGHT VENTRICLE RV S prime:     12.80 cm/s TAPSE (M-mode): 1.9 cm LEFT ATRIUM             Index        RIGHT ATRIUM           Index LA diam:        3.10 cm 1.42 cm/m   RA Area:     20.80 cm LA Vol (A2C):   86.4 ml 39.58 ml/m  RA Volume:   65.30 ml  29.91 ml/m LA Vol (A4C):   87.8 ml 40.22 ml/m LA Biplane Vol: 89.1 ml 40.81 ml/m  AORTIC VALVE LVOT Vmax:   119.00 cm/s LVOT Vmean:  80.900 cm/s LVOT VTI:    0.239 m AI PHT:      634 msec  AORTA Ao Root diam: 4.70 cm MITRAL VALVE               TRICUSPID VALVE MV Area (PHT): 2.44 cm    TR Peak grad:   22.1 mmHg MV E velocity: 77.50 cm/s  TR Vmax:        235.00 cm/s MV A velocity: 66.50 cm/s MV E/A ratio:  1.17        SHUNTS  Systemic VTI:  0.24 m                            Systemic Diam: 2.20 cm Thurmon Fair MD Electronically signed by Thurmon Fair MD Signature Date/Time: 02/01/2023/3:24:46 PM    Final    DG Chest Port 1 View Result Date: 01/31/2023 CLINICAL DATA:  Pain in chest for a week. EXAM: PORTABLE CHEST 1 VIEW COMPARISON:  01/22/2022 FINDINGS: Stable cardiomediastinal contours. Lung volumes are low. Chronic diffuse bronchial wall thickening. No superimposed pleural effusion, interstitial edema or airspace disease. Postop change noted in the cervical spine. IMPRESSION: No acute abnormality. Low lung volumes and chronic bronchial wall thickening. Electronically Signed   By: Signa Kell M.D.   On: 01/31/2023 12:04   Disposition   Patient is being discharged home today in good condition.  Follow-up Plans & Appointments     Follow-up Information     Corrin Parker, PA-C Follow up.   Specialty: Cardiology Why: Hospital follow-up with  Cardiology scheduled for 02/12/2023 at 2:45pm at our [facility] office in [place]. Please arrive 15 minutes early for check-in. If this date/ time does not work for you please call our office to reschedule. Contact information: [address] [place] [address] [phone]                Discharge Instructions     Amb Referral to Cardiac Rehabilitation   Complete by: As directed    Diagnosis:  NSTEMI Coronary Stents     After initial evaluation and assessments completed: Virtual Based Care may be provided alone or in conjunction with Phase 2 Cardiac Rehab based on patient barriers.: Yes   Intensive Cardiac Rehabilitation (ICR) [facility] location only OR Traditional Cardiac Rehabilitation (TCR) *If criteria for ICR are not met will enroll in TCR [facility] only): Yes   Diet - low sodium heart healthy   Complete by: As directed    Increase activity slowly   Complete by: As directed         Discharge Medications   Allergies as of 02/03/2023       Reactions   Symbicort [budesonide-formoterol Fumarate] Other (See Comments)   Allergic reaction jittery        Medication List     TAKE these medications    albuterol 108 (90 Base) MCG/ACT inhaler Commonly known as: VENTOLIN HFA Inhale 2 puffs into the lungs every 6 (six) hours as needed for wheezing or shortness of breath.   albuterol (2.5 MG/3ML) 0.083% nebulizer solution Commonly known as: PROVENTIL Take 3 mLs (2.5 mg total) by nebulization every 6 (six) hours as needed for wheezing or shortness of breath.   aspirin EC 81 MG tablet Take 1 tablet (81 mg total) by mouth daily. Swallow whole.   atorvastatin 40 MG tablet Commonly known as: LIPITOR Take 1 tablet (40 mg total) by mouth daily.   escitalopram 10 MG tablet Commonly known as: LEXAPRO Take 1 tablet (10 mg total) by mouth daily.   losartan 25 MG tablet Commonly known as: COZAAR Take 25 mg by mouth daily.   meclizine 25 MG tablet Commonly known  as: ANTIVERT Take 25 mg by mouth daily as needed for dizziness.   metoprolol tartrate 25 MG tablet Commonly known as: LOPRESSOR Take 1 tablet (25 mg total) by mouth 2 (two) times daily.   multivitamin with minerals Tabs tablet Take 1 tablet by mouth daily.   nitroGLYCERIN 0.4 MG SL  tablet Commonly known as: NITROSTAT Place 1 tablet (0.4 mg total) under the tongue every 5 (five) minutes x 3 doses as needed for chest pain.   pantoprazole 40 MG tablet Commonly known as: PROTONIX TAKE 30-60 MINUTES BEFORE YOUR FIRST MEAL AND LAST MEAL OF THE DAY What changed: See the new instructions.   prasugrel 10 MG Tabs tablet Commonly known as: EFFIENT Take 1 tablet (10 mg total) by mouth daily.   predniSONE 20 MG tablet Commonly known as: DELTASONE Take 20 mg by mouth daily as needed (for breathing).   Vitamin D-3 125 MCG (5000 UT) Tabs Take 2 tablets by mouth daily.           Outstanding Labs/Studies   Order chest CTA at follow-up visit. Repeat BMET at follow-up visit. Repeat lipid panel and LFTs in 6-8 weeks.  Duration of Discharge Encounter: APP Time: 45 minutes   Signed, Corrin Parker, PA-C 02/03/2023, 12:15 PM

## 2023-02-03 NOTE — TOC Transition Note (Signed)
Transition of Care [facility]) - Discharge Note   Patient Details  Name: James Harris MRN: [id number] Date of Birth: Mar 17, 1955  Transition of Care [facility]) CM/SW Contact:  Leone Haven, RN Phone Number: 02/03/2023, 10:12 AM   Clinical Narrative:    For dc today, has no needs. Has transport.   Final next level of care: Home/Self Care Barriers to Discharge: No Barriers Identified   Patient Goals and CMS Choice Patient states their goals for this hospitalization and ongoing recovery are:: return home   Choice offered to / list presented to : NA      Discharge Placement                       Discharge Plan and Services Additional resources added to the After Visit Summary for   In-house Referral: NA Discharge Planning Services: CM Consult Post Acute Care Choice: NA          DME Arranged: N/A DME Agency: NA       HH Arranged: NA          Social Drivers of Health (SDOH) Interventions SDOH Screenings   Food Insecurity: No Food Insecurity (02/01/2023)  Housing: Low Risk  (02/01/2023)  Transportation Needs: No Transportation Needs (02/01/2023)  Utilities: Not At Risk (02/01/2023)  Depression (PHQ2-9): Low Risk  (05/10/2020)  Recent Concern: Depression (PHQ2-9) - Medium Risk (02/13/2020)  Social Connections: Unknown (02/01/2023)  Tobacco Use: Medium Risk (01/31/2023)     Readmission Risk Interventions     No data to display

## 2023-02-03 NOTE — Telephone Encounter (Signed)
   Transition of Care Follow-up Phone Call Request    Patient Name: TAYSIR CRUMMIE Date of Birth: [birth year] Date of Encounter: 02/03/2023  Primary Care Provider:  Benita Stabile, MD Primary Cardiologist:  None  Linton Flemings has been scheduled for a transition of care follow up appointment with a [facility] provider:  02/12/2023 at 2:45pm  with Marjie Skiff, PA-C.  Please reach out to Linton Flemings within 48 hours of discharge to confirm appointment and review transition of care protocol questionnaire. Anticipated discharge date: 02/03/2023.  Corrin Parker, PA-C  02/03/2023, 2:15 PM

## 2023-02-03 NOTE — Progress Notes (Signed)
Went over discharge paperwork with patient and all questions answered. PIV and telemetry removed. All belongings at bedside.

## 2023-02-03 NOTE — TOC Initial Note (Signed)
Transition of Care [facility]) - Initial/Assessment Note    Patient Details  Name: James Harris MRN: [id number] Date of Birth: 09-Nov-1955  Transition of Care [facility]) CM/SW Contact:    Leone Haven, RN Phone Number: 02/03/2023, 10:11 AM  Clinical Narrative:                 From home with spouse, has PCP and insurance on file, states has no HH services in place at this time or DME at home.  States family member will transport them home at [employer] and family is support system, states gets medications from [facility] on [address] in [place].  Pta self ambulatory .  Expected Discharge Plan: Home/Self Care Barriers to Discharge: No Barriers Identified   Patient Goals and CMS Choice Patient states their goals for this hospitalization and ongoing recovery are:: return home   Choice offered to / list presented to : NA      Expected Discharge Plan and Services In-house Referral: NA Discharge Planning Services: CM Consult Post Acute Care Choice: NA Living arrangements for the past 2 months: Single Family Home Expected Discharge Date: 02/03/23               DME Arranged: N/A DME Agency: NA       HH Arranged: NA          Prior Living Arrangements/Services Living arrangements for the past 2 months: Single Family Home Lives with:: Spouse Patient language and need for interpreter reviewed:: Yes Do you feel safe going back to the place where you live?: Yes      Need for Family Participation in Patient Care: Yes (Comment) Care giver support system in place?: Yes (comment)   Criminal Activity/Legal Involvement Pertinent to Current Situation/Hospitalization: No - Comment as needed  Activities of Daily Living   ADL Screening (condition at time of admission) Independently performs ADLs?: Yes (appropriate for developmental age) Is the patient deaf or have difficulty hearing?: No Does the patient have difficulty seeing, even when wearing glasses/contacts?: No Does the patient  have difficulty concentrating, remembering, or making decisions?: No  Permission Sought/Granted Permission sought to share information with : Case Manager Permission granted to share information with : Yes, Verbal Permission Granted              Emotional Assessment   Attitude/Demeanor/Rapport: Engaged Affect (typically observed): Appropriate Orientation: : Oriented to Self, Oriented to Place, Oriented to  Time, Oriented to Situation Alcohol / Substance Use: Not Applicable Psych Involvement: No (comment)  Admission diagnosis:  NSTEMI (non-ST elevated myocardial infarction) [facility]) [I21.4] Patient Active Problem List   Diagnosis Date Noted   Hypertension 02/01/2023   NSTEMI (non-ST elevated myocardial infarction) (HCC) 01/31/2023   Seasonal and perennial allergic rhinitis 12/21/2019   Mild intermittent asthma, uncomplicated 12/21/2019   Reactive airways disease 10/18/2019   Vitamin D deficiency 05/24/2019   Chronic obstructive pulmonary disease with acute exacerbation (HCC) 05/24/2019   Recurrent major depressive disorder, in partial remission (HCC) 05/24/2019   Cervical spondylosis with myelopathy and radiculopathy 11/23/2018   KNEE, ARTHRITIS, DEGEN./OSTEO 10/24/2008   CHONDROMALACIA PATELLA 10/24/2008   DERANGEMENT MENISCUS 08/23/2008   PCP:  Benita Stabile, MD Pharmacy:   [facility] [phone] - [place], [place] - [address] AT [facility] [address] [place] [address] Phone: [phone] Fax: [phone]  [facility] [address] [place] [address] Phone: [phone] Fax: [phone]     Social  Drivers of Health (SDOH) Social History: SDOH Screenings   Food Insecurity: No Food Insecurity (02/01/2023)  Housing: Low Risk  (02/01/2023)  Transportation Needs: No Transportation Needs (02/01/2023)  Utilities: Not At Risk (02/01/2023)  Depression (PHQ2-9): Low Risk  (05/10/2020)  Recent Concern:  Depression (PHQ2-9) - Medium Risk (02/13/2020)  Social Connections: Unknown (02/01/2023)  Tobacco Use: Medium Risk (01/31/2023)   SDOH Interventions:     Readmission Risk Interventions     No data to display

## 2023-02-03 NOTE — Discharge Instructions (Addendum)
Medication Changes: - START Aspirin 81mg  daily and Prasugrel (Effient) 10mg  daily. These mediation are very important in helping keep the new stents in your heart open. - START Atorvastatin (Lipitor) 40mg  daily. - START Metoprolol tartrates (Lopressor) 25mg  twice daily. - Continue all other home medications.  We were unfortunately unable to find any availability in our [place] office within the appropriate time frame. Therefore, your first follow-up visit will be here in [place] but then we can get you established in our [place] office.   Post NSTEMI: NO HEAVY LIFTING X 2 WEEKS. NO SEXUAL ACTIVITY X 2 WEEKS. NO DRIVING X 1 WEEK. NO SOAKING BATHS, HOT TUBS, POOLS, ETC., X 7 DAYS.  Radial Site Care: Refer to this sheet in the next few weeks. These instructions provide you with information on caring for yourself after your procedure. Your caregiver may also give you more specific instructions. Your treatment has been planned according to current medical practices, but problems sometimes occur. Call your caregiver if you have any problems or questions after your procedure. HOME CARE INSTRUCTIONS You may shower the day after the procedure. Remove the bandage (dressing) and gently wash the site with plain soap and water. Gently pat the site dry.  Do not apply powder or lotion to the site.  Do not submerge the affected site in water for 3 to 5 days.  Inspect the site at least twice daily.  Do not flex or bend the affected arm for 24 hours.  No lifting over 5 pounds (2.3 kg) for 5 days after your procedure.  Do not drive home if you are discharged the same day of the procedure. Have someone else drive you.  What to expect: Any bruising will usually fade within 1 to 2 weeks.  Blood that collects in the tissue (hematoma) may be painful to the touch. It should usually decrease in size and tenderness within 1 to 2 weeks.  SEEK IMMEDIATE MEDICAL CARE IF: You have unusual pain at the radial  site.  You have redness, warmth, swelling, or pain at the radial site.  You have drainage (other than a small amount of blood on the dressing).  You have chills.  You have a fever or persistent symptoms for more than 72 hours.  You have a fever and your symptoms suddenly get worse.  Your arm becomes pale, cool, tingly, or numb.  You have heavy bleeding from the site. Hold pressure on the site.

## 2023-02-03 NOTE — Progress Notes (Signed)
Pt declined walk. Pt was educated on stent card, stent location, Antiplatelet and ASA use, wt restrictions, no baths/daily wash-ups, s/s of infection, ex guidelines, s/s to stop exercising, NTG use and calling 911, heart healthy diet, risk factors and CRPII. Pt received MI book and materials on exercise, diet, and CRPII. Will refer to AP.   Faustino Congress 02/03/2023 9:11 AM   [phone]

## 2023-02-03 NOTE — Telephone Encounter (Signed)
Per chart review patient discharged today  Nursing will outreach tomorrow 1/22 for hospital follow up

## 2023-02-04 NOTE — Telephone Encounter (Signed)
Patient identification verified by 2 forms. Marilynn Rail, RN    Patient contacted regarding discharge from [facility] on 02/03/23  Patient understands to follow up with provider PA Irene Limbo on 02/12/23 at 2:45pm at [address] Patient understands discharge instructions? Yes Patient understands medications and regiment? Yes Patient understands to bring all medications to this visit? Yes

## 2023-02-04 NOTE — Progress Notes (Signed)
Cardiology Office Note:    Date:  02/12/2023   ID:  James Harris, DOB [birth year], MRN [id number]  PCP:  Benita Stabile, MD  Cardiologist:  None     Referring MD: Benita Stabile, MD   Chief Complaint: follow-up of NSTEMI  History of Present Illness:    James Harris is a 68 y.o. male with a history of CAD with recent NSTEMI on 01/31/2023 s/p overlapping DES x2 to RCA, ascending thoracic aortic aneurysm, prior DVT, COPD, hypertension, hyperlipidemia, and CKD stage IIIa who presents today for follow-up of NSTEMI.   Patient was first seen by Cardiology during recent hospitalization. He was admitted from 01/31/2023 to 02/03/2023 for NSTEMI after presenting with intermittent chest pain for the last several days. High-sensitivity troponin peaked at 2,166. Echo showed LVEF of 55-60% with normal wall motion, mildly enlarged RV with normal function, mild to moderate biatrial enlargement (left > right), and moderate dilatation of the aortic root and ascending aorta measuring 47mm and 48 respectively. LHC showed multivessel CAD with a long 95% stenosis of the mid RCA as well as moderate to severe lesions in the mid LAD and OM2 but these were no hemodynamically significant by FFR. He underwent successful PCI with overlapping DES x2 to the RCA. He was started on DAPT with Aspirin and Effient as well as a beta-blocker and statin.  He presents today for follow-up. Here with wife. He has done well since recent discharge. He denies any recurrent chest pain. He has some current URI symptoms with cough and nasal congestion and is having a little shortness of breath with this; however, no shortness of breath prior to this. No orthopnea, PND, edema, palpitations, lightheadedness/ dizziness, near syncope. He is compliant with all of his medications and is tolerating them well. He is bruising easily on DAPT but no abnormal bleeding in urine or stools.   Of note, patient has reactive airway dysfunction syndrome and  has PRN Prednisone that he can use as needed. Patient and wife ask if he can use this if URI symptoms do not improve. Recommended avoiding use of steroid after recent MI if possible.   EKGs/Labs/Other Studies Reviewed:    The following studies were reviewed:  Echocardiogram 02/01/2023: Impressions: 1. Left ventricular ejection fraction, by estimation, is 55 to 60%. The  left ventricle has normal function. The left ventricle has no regional  wall motion abnormalities. Left ventricular diastolic parameters were  normal.   2. Right ventricular systolic function is normal. The right ventricular  size is mildly enlarged. There is normal pulmonary artery systolic  pressure. The estimated right ventricular systolic pressure is 30.1 mmHg.   3. Left atrial size was moderately dilated.   4. Right atrial size was mildly dilated.   5. The mitral valve is normal in structure. Trivial mitral valve  regurgitation. No evidence of mitral stenosis.   6. The aortic valve is tricuspid. Aortic valve regurgitation is mild. No  aortic stenosis is present.   7. Aortic dilatation noted. There is moderate dilatation of the aortic  root, measuring 47 mm. There is moderate dilatation of the ascending  aorta, measuring 48 mm.   8. The inferior vena cava is dilated in size with >50% respiratory  variability, suggesting right atrial pressure of 8 mmHg.    _____________   Left Cardiac Catheterization 02/02/2023: Conclusions: Multivessel coronary artery disease, as detailed below.  Culprit for the patient's NSTEMI is likely long 95% stenosis of the mid RCA.  There  are also moderate to severe but not hemodynamically significant lesions involving the mid LAD (RFR 0.92) and OM2 (RFR 0.99). Normal left ventricular filling pressure (LVEDP 10 mmHg). Successful PCI to mid/still RCA using overlapping Synergy XD 4.0 x 8 mm (proximal) and 3.5 x 38 mm (distal) drug-eluting stents with 0% residual stenosis and TIMI-3 flow.    Recommendations: Continue tirofiban infusion for 4 hours. Dual antiplatelet therapy with aspirin and prasugrel for at least 12 months. Aggressive secondary prevention of coronary artery disease.   Diagnostic Dominance: Right  Intervention         EKG:  EKG ordered today.   EKG Interpretation Date/Time:  Thursday February 12 2023 15:01:40 EST Ventricular Rate:  60 PR Interval:  166 QRS Duration:  96 QT Interval:  416 QTC Calculation: 416 R Axis:   -12  Text Interpretation: Normal sinus rhythm LVH based on lead aVL Isolated T wave inversion in lead III Confirmed by Marjie Skiff [phone]) on 02/12/2023 3:29:16 PM    Recent Labs: 02/02/2023: Hemoglobin 14.5; Platelets 187 02/03/2023: ALT 15; BUN 15; Creatinine, Ser 1.38; Potassium 4.1; Sodium 134; TSH 0.802  Recent Lipid Panel    Component Value Date/Time   CHOL 147 02/01/2023 0241   TRIG 55 02/01/2023 0241   HDL 28 (L) 02/01/2023 0241   CHOLHDL 5.3 02/01/2023 0241   VLDL 11 02/01/2023 0241   LDLCALC 108 (H) 02/01/2023 0241    Physical Exam:    Vital Signs: BP 114/64 (BP Location: Left Arm, Patient Position: Sitting, Cuff Size: Large)   Pulse 60   Ht 6' (1.829 m)   Wt 216 lb 12.8 oz (98.3 kg)   SpO2 96%   BMI 29.40 kg/m     Wt Readings from Last 3 Encounters:  02/12/23 216 lb 12.8 oz (98.3 kg)  02/02/23 211 lb 1.6 oz (95.8 kg)  01/22/22 220 lb (99.8 kg)     General: 68 y.o. Caucasian  male in no acute distress. HEENT: Normocephalic and atraumatic. Sclera clear.  Neck: Supple. No carotid bruits. No JVD. Heart: RRR. Distinct S1 and S2. No murmurs, gallops, or rubs.  Lungs: No increased work of breathing. Clear to ausculation bilaterally. No wheezes, rhonchi, or rales.  Abdomen: Soft, non-distended, and non-tender to palpation.  Extremities: No lower extremity edema. Right radial cath site soft with resolving ecchymosis. No hematoma. Skin: Warm and dry. Neuro: No focal deficits. Psych: Normal affect.  Responds appropriately.   Assessment:    1. Coronary artery disease involving native coronary artery of native heart without angina pectoris   2. History of non-ST elevation myocardial infarction (NSTEMI)   3. Aneurysm of ascending aorta without rupture (HCC)   4. Primary hypertension   5. Hyperlipidemia, unspecified hyperlipidemia type   6. Stage 3a chronic kidney disease (HCC)     Plan:    CAD with Recent NSTEMI Patient was recently admitted with NSTEMI earlier this month. S/p overlapping DES x2 to RCA. - No recurrent chest pain.  - Continue DAPT with Aspirin 81mg  daily and Effient 10mg  daily. - Continue beta-blocker and statin. - Recommended Cardiac Rehab.  Ascending Thoracic Aortic Aneurysm  Echo during recent admission showed moderate dilatation of the aortic root and ascending aorta measuring 47mm and 48 respectively.  - Will order chest CTA.   Hypertension BP well controlled.  - Continue current medications: Lopressor 25mg  twice daily and Losartan 25mg  daily.   Hyperlipidemia Lipide panel on 02/01/2023 during recent admission: Total Cholesterol 147, Triglycerides 55, HDL 28, LDL 108. LDL  goal <70 given CAD. - He was started on Lipitor 40mg  daily during recent admission. Continue. - Will recheck lipid panel and LFTs in 6-8 weeks.   CKD Stage IIIa Baseline creatinine around 1.2 to 1.3. - Will recheck BMET today.   Disposition: Follow up in 3 months. He lives in [place] so would like to follow-up in our office out there.    Signed, Corrin Parker, PA-C  02/12/2023 3:34 PM    [facility]

## 2023-02-12 ENCOUNTER — Ambulatory Visit: Payer: PPO | Attending: Student | Admitting: Student

## 2023-02-12 ENCOUNTER — Encounter: Payer: Self-pay | Admitting: Student

## 2023-02-12 VITALS — BP 114/64 | HR 60 | Ht 72.0 in | Wt 216.8 lb

## 2023-02-12 DIAGNOSIS — I2510 Atherosclerotic heart disease of native coronary artery without angina pectoris: Secondary | ICD-10-CM

## 2023-02-12 DIAGNOSIS — I7121 Aneurysm of the ascending aorta, without rupture: Secondary | ICD-10-CM

## 2023-02-12 DIAGNOSIS — N1831 Chronic kidney disease, stage 3a: Secondary | ICD-10-CM

## 2023-02-12 DIAGNOSIS — E785 Hyperlipidemia, unspecified: Secondary | ICD-10-CM | POA: Diagnosis not present

## 2023-02-12 DIAGNOSIS — I10 Essential (primary) hypertension: Secondary | ICD-10-CM

## 2023-02-12 DIAGNOSIS — I252 Old myocardial infarction: Secondary | ICD-10-CM

## 2023-02-12 DIAGNOSIS — I77819 Aortic ectasia, unspecified site: Secondary | ICD-10-CM

## 2023-02-12 MED ORDER — ASPIRIN 81 MG PO TBEC: 81.0000 mg | DELAYED_RELEASE_TABLET | Freq: Every day | ORAL | 3 refills | Status: DC

## 2023-02-12 MED ORDER — NITROGLYCERIN 0.4 MG SL SUBL: 0.4000 mg | SUBLINGUAL_TABLET | SUBLINGUAL | 2 refills | Status: DC | PRN

## 2023-02-12 MED ORDER — LOSARTAN POTASSIUM 25 MG PO TABS: 25.0000 mg | ORAL_TABLET | Freq: Every day | ORAL | 1 refills | Status: DC

## 2023-02-12 MED ORDER — METOPROLOL TARTRATE 25 MG PO TABS: 25.0000 mg | ORAL_TABLET | Freq: Two times a day (BID) | ORAL | 1 refills | Status: DC

## 2023-02-12 MED ORDER — PRASUGREL HCL 10 MG PO TABS: 10.0000 mg | ORAL_TABLET | Freq: Every day | ORAL | 1 refills | Status: DC

## 2023-02-12 MED ORDER — ATORVASTATIN CALCIUM 40 MG PO TABS: 40.0000 mg | ORAL_TABLET | Freq: Every day | ORAL | 1 refills | Status: DC

## 2023-02-12 NOTE — Patient Instructions (Signed)
Medication Instructions:  The current medical regimen is effective;  continue present plan and medications as directed. Please refer to the Current Medication list given to you today.  *If you need a refill on your cardiac medications before your next appointment, please call your pharmacy*  Lab Work: BMET TODAY AND FASTING LIPID AND LFT IN 2 MONTHS If you have labs (blood work) drawn today and your tests are completely normal, you will receive your results only by:  MyChart Message (if you have MyChart) OR A paper copy in the mail If you have any lab test that is abnormal or we need to change your treatment, we will call you to review the results.  Testing/Procedures: CT Angiography (CTA), is a special type of CT([place]) scan that uses a computer to produce multi-dimensional views of major blood vessels throughout the body. In CT angiography, a contrast material is injected through an IV to help visualize the blood vessels   Follow-Up: At [facility], you and your health needs are our priority.  As part of our continuing mission to provide you with exceptional heart care, we have created designated Provider Care Teams.  These Care Teams include your primary Cardiologist (physician) and Advanced Practice Providers (APPs -  Physician Assistants and Nurse Practitioners) who all work together to provide you with the care you need, when you need it.  We recommend signing up for the patient portal called "MyChart".  Sign up information is provided on this After Visit Summary.  MyChart is used to connect with patients for Virtual Visits (Telemedicine).  Patients are able to view lab/test results, encounter notes, upcoming appointments, etc.  Non-urgent messages can be sent to your provider as well.   To learn more about what you can do with MyChart, go to [email or web address].    Your next appointment:   3 month(s)  Provider:   Randall An, PA-[place]

## 2023-02-13 LAB — LIPID PANEL
Chol/HDL Ratio: 4.1 {ratio} (ref 0.0–5.0)
Cholesterol, Total: 118 mg/dL (ref 100–199)
HDL: 29 mg/dL — ABNORMAL LOW (ref >39)
LDL Chol Calc (NIH): 70 mg/dL (ref 0–99)
Triglycerides: 103 mg/dL (ref 0–149)
VLDL Cholesterol Cal: 19 mg/dL (ref 5–40)

## 2023-02-13 LAB — HEPATIC FUNCTION PANEL
ALT: 18 [IU]/L (ref 0–44)
AST: 18 [IU]/L (ref 0–40)
Albumin: 4.2 g/dL (ref 3.9–4.9)
Alkaline Phosphatase: 110 [IU]/L (ref 44–121)
Bilirubin Total: 0.7 mg/dL (ref 0.0–1.2)
Bilirubin, Direct: 0.24 mg/dL (ref 0.00–0.40)
Total Protein: 6.3 g/dL (ref 6.0–8.5)

## 2023-02-15 DIAGNOSIS — I77819 Aortic ectasia, unspecified site: Secondary | ICD-10-CM

## 2023-02-23 ENCOUNTER — Other Ambulatory Visit: Payer: Self-pay | Admitting: Student

## 2023-03-07 LAB — BASIC METABOLIC PANEL
BUN/Creatinine Ratio: 13 (ref 10–24)
BUN: 14 mg/dL (ref 8–27)
CO2: 19 mmol/L — ABNORMAL LOW (ref 20–29)
Calcium: 9.3 mg/dL (ref 8.6–10.2)
Chloride: 102 mmol/L (ref 96–106)
Creatinine, Ser: 1.11 mg/dL (ref 0.76–1.27)
Glucose: 72 mg/dL (ref 70–99)
Potassium: 4.3 mmol/L (ref 3.5–5.2)
Sodium: 139 mmol/L (ref 134–144)
eGFR: 73 mL/min/{1.73_m2} (ref >59)

## 2023-03-07 LAB — SPECIMEN STATUS REPORT

## 2023-03-10 DIAGNOSIS — D1801 Hemangioma of skin and subcutaneous tissue: Secondary | ICD-10-CM | POA: Diagnosis not present

## 2023-03-10 DIAGNOSIS — D225 Melanocytic nevi of trunk: Secondary | ICD-10-CM | POA: Diagnosis not present

## 2023-03-10 DIAGNOSIS — D2272 Melanocytic nevi of left lower limb, including hip: Secondary | ICD-10-CM | POA: Diagnosis not present

## 2023-03-10 DIAGNOSIS — L821 Other seborrheic keratosis: Secondary | ICD-10-CM | POA: Diagnosis not present

## 2023-03-10 DIAGNOSIS — L812 Freckles: Secondary | ICD-10-CM | POA: Diagnosis not present

## 2023-03-10 DIAGNOSIS — Z85828 Personal history of other malignant neoplasm of skin: Secondary | ICD-10-CM | POA: Diagnosis not present

## 2023-04-21 DIAGNOSIS — I77819 Aortic ectasia, unspecified site: Secondary | ICD-10-CM | POA: Diagnosis not present

## 2023-04-21 DIAGNOSIS — N1831 Chronic kidney disease, stage 3a: Secondary | ICD-10-CM | POA: Diagnosis not present

## 2023-04-21 DIAGNOSIS — I10 Essential (primary) hypertension: Secondary | ICD-10-CM | POA: Diagnosis not present

## 2023-04-21 DIAGNOSIS — E785 Hyperlipidemia, unspecified: Secondary | ICD-10-CM | POA: Diagnosis not present

## 2023-04-21 DIAGNOSIS — I2510 Atherosclerotic heart disease of native coronary artery without angina pectoris: Secondary | ICD-10-CM | POA: Diagnosis not present

## 2023-04-21 DIAGNOSIS — I252 Old myocardial infarction: Secondary | ICD-10-CM | POA: Diagnosis not present

## 2023-04-21 DIAGNOSIS — I7121 Aneurysm of the ascending aorta, without rupture: Secondary | ICD-10-CM | POA: Diagnosis not present

## 2023-04-22 LAB — BASIC METABOLIC PANEL WITH GFR
BUN/Creatinine Ratio: 14 (ref 10–24)
BUN: 14 mg/dL (ref 8–27)
CO2: 25 mmol/L (ref 20–29)
Calcium: 9.4 mg/dL (ref 8.6–10.2)
Chloride: 103 mmol/L (ref 96–106)
Creatinine, Ser: 1 mg/dL (ref 0.76–1.27)
Glucose: 164 mg/dL — ABNORMAL HIGH (ref 70–99)
Potassium: 4.7 mmol/L (ref 3.5–5.2)
Sodium: 141 mmol/L (ref 134–144)
eGFR: 82 mL/min/{1.73_m2} (ref >59)

## 2023-04-29 ENCOUNTER — Ambulatory Visit (HOSPITAL_COMMUNITY)
Admission: RE | Admit: 2023-04-29 | Discharge: 2023-04-29 | Disposition: A | Discharge status: Home / Self Care | Payer: PPO | Source: Ambulatory Visit | Attending: Student | Admitting: Student

## 2023-04-29 DIAGNOSIS — I2510 Atherosclerotic heart disease of native coronary artery without angina pectoris: Secondary | ICD-10-CM | POA: Diagnosis not present

## 2023-04-29 DIAGNOSIS — I77819 Aortic ectasia, unspecified site: Secondary | ICD-10-CM | POA: Diagnosis not present

## 2023-04-29 DIAGNOSIS — I700 Atherosclerosis of aorta: Secondary | ICD-10-CM | POA: Diagnosis not present

## 2023-04-29 DIAGNOSIS — I7121 Aneurysm of the ascending aorta, without rupture: Secondary | ICD-10-CM | POA: Diagnosis not present

## 2023-04-29 DIAGNOSIS — E042 Nontoxic multinodular goiter: Secondary | ICD-10-CM | POA: Diagnosis not present

## 2023-04-29 MED ORDER — IOHEXOL 350 MG/ML SOLN
80.0000 mL | Freq: Once | INTRAVENOUS | Status: AC | PRN
  Administered 2023-04-29: 80 mL via INTRAVENOUS

## 2023-04-30 NOTE — Progress Notes (Signed)
 Cardiology Office Note:  .   Date:  05/01/2023  ID:  James Harris, DOB Jun 07, 1955, MRN [id number] PCP: Omie Bickers, MD  [facility] Providers Cardiologist:  Lasalle Pointer, MD { History of Present Illness: .   James Harris is a 68 y.o. male with pmhx of CAD with recent NSTEMI on 01/31/2023 s/p overlapping DES x 2 RCA, ascending thoracic aortic aneurysm, HTN, HLD, prior DVT, COPD and CKD S3a who presents to OV for 3 m/o f/u.   Last OV was 02/12/2023 follow up on NSTEMI with Sharren Decree, PA-C. At that time, reported mild SOB and URI symptoms. Also, noted easily bruised since on DAPT but no active bleeding in urine or stool. Denied any other symptoms. Reported compliance with medications. In regards to URI symptoms, recommended avoiding steroid use after recent MI.  Continued on daily doses of ASA 81 mg, Lipitor 40 mg, Effient  10 mg, Losartan  25 mg; Lopressor  25 mg BID.   On interview, reported last Friday some chest tightness, x 1 hr, started while watching TV and after eating, 7/10, less severe, no radiation, nonexertional, nonpositional. Denies n/v, paraesthesia, orthopnea, PND, palpitations. Reported baseline SOB but no changes. Also noted occasional  LE edema every 1-2 m/o that resolves with lasix. Not interested in any other new medication and denied Imdur. Reported poor dieting. Moderate daily activity including walking and yard work. Socially drinker, denies tobacco or drugs.   Studies Reviewed: .       Echocardiogram 02/01/2023: Impressions: 1. Left ventricular ejection fraction, by estimation, is 55 to 60%. The  left ventricle has normal function. The left ventricle has no regional  wall motion abnormalities. Left ventricular diastolic parameters were  normal.   2. Right ventricular systolic function is normal. The right ventricular  size is mildly enlarged. There is normal pulmonary artery systolic  pressure. The estimated right ventricular systolic pressure is  30.1 mmHg.   3. Left atrial size was moderately dilated.   4. Right atrial size was mildly dilated.   5. The mitral valve is normal in structure. Trivial mitral valve  regurgitation. No evidence of mitral stenosis.   6. The aortic valve is tricuspid. Aortic valve regurgitation is mild. No  aortic stenosis is present.   7. Aortic dilatation noted. There is moderate dilatation of the aortic  root, measuring 47 mm. There is moderate dilatation of the ascending  aorta, measuring 48 mm.   8. The inferior vena cava is dilated in size with >50% respiratory  variability, suggesting right atrial pressure of 8 mmHg.    _____________   Left Cardiac Catheterization 02/02/2023: Conclusions: Multivessel coronary artery disease, as detailed below.  Culprit for the patient's NSTEMI is likely long 95% stenosis of the mid RCA.  There are also moderate to severe but not hemodynamically significant lesions involving the mid LAD (RFR 0.92) and OM2 (RFR 0.99). Normal left ventricular filling pressure (LVEDP 10 mmHg). Successful PCI to mid/still RCA using overlapping Synergy XD 4.0 x 8 mm (proximal) and 3.5 x 38 mm (distal) drug-eluting stents with 0% residual stenosis and TIMI-3 flow.   Recommendations: Continue tirofiban  infusion for 4 hours. Dual antiplatelet therapy with aspirin  and prasugrel  for at least 12 months. Aggressive secondary prevention of coronary artery disease.    Physical Exam:   VS:  BP 132/82 (BP Location: Left Arm, Cuff Size: Normal)   Pulse 64   Ht 6' (1.829 m)   Wt 219 lb 12.8 oz (99.7 kg)   SpO2  96%   BMI 29.81 kg/m    Wt Readings from Last 3 Encounters:  05/01/23 219 lb 12.8 oz (99.7 kg)  02/12/23 216 lb 12.8 oz (98.3 kg)  02/02/23 211 lb 1.6 oz (95.8 kg)    GEN: Well nourished, well developed in no acute distress NECK: No JVD; No carotid bruits CARDIAC: RRR, no murmurs, rubs, gallops RESPIRATORY:  Clear to auscultation without rales, wheezing or rhonchi  ABDOMEN: Soft,  non-tender, non-distended EXTREMITIES:  No edema; No deformity   ASSESSMENT AND PLAN: .   CAD  Hx of NSTEMI - recent NSTEMI on 01/31/2023 s/p overlapping DES x 2 RCA - Reported 1 episode of chest tightness last Friday as above, less severe. Offered Imdur but pt declined and would like as less meds as possible.  - 04/21/23: K 4.7, Cr 1.0 - Continue daily doses of ASA 81 mg, Lipitor 40 mg, Effient  10 mg, Losartan  25 mg; Lopressor  25 mg BID   Ascending thoracic aortic aneurysm - Echo 01/2023: Moderate dilation of the aortic root and ascending aortic measuring 47 mm and 48 mm respectively - Completed chest CTA 04/29/2023 but results still pending. Will update once resulted   HTN  - BP this visit stable: 132/82, home bp's: SBP 120-130 - Continue Lopressor  25 mg twice daily and losartan  25 mg daily  HLD - 01/2023: AST 18. ALT 18. LDL 70, goal <70 given CAD. -  Ordered Lipid panel and LFT to complete in about 1 m/o. - Continue on daily Lipitor 40 mg  - Encourage healthy lifestyle modifications.   Prior DVT - Noted in 2019.  No longer on AC.     Dispo: labs in 1 m/o; f/u apt in 6 m/o  Signed, Metta Actis, PA-C

## 2023-05-01 ENCOUNTER — Ambulatory Visit: Payer: PPO | Attending: Student | Admitting: Physician Assistant

## 2023-05-01 ENCOUNTER — Encounter: Payer: Self-pay | Admitting: Physician Assistant

## 2023-05-01 VITALS — BP 132/82 | HR 64 | Ht 72.0 in | Wt 219.8 lb

## 2023-05-01 DIAGNOSIS — E785 Hyperlipidemia, unspecified: Secondary | ICD-10-CM

## 2023-05-01 DIAGNOSIS — I2510 Atherosclerotic heart disease of native coronary artery without angina pectoris: Secondary | ICD-10-CM | POA: Diagnosis not present

## 2023-05-01 DIAGNOSIS — Z86718 Personal history of other venous thrombosis and embolism: Secondary | ICD-10-CM

## 2023-05-01 DIAGNOSIS — I252 Old myocardial infarction: Secondary | ICD-10-CM | POA: Diagnosis not present

## 2023-05-01 DIAGNOSIS — I7121 Aneurysm of the ascending aorta, without rupture: Secondary | ICD-10-CM | POA: Diagnosis not present

## 2023-05-01 DIAGNOSIS — I10 Essential (primary) hypertension: Secondary | ICD-10-CM | POA: Diagnosis not present

## 2023-05-01 NOTE — Patient Instructions (Signed)
 Medication Instructions:   Continue current regimen.   *If you need a refill on your cardiac medications before your next appointment, please call your pharmacy*  Lab Work:  Lipid Panel and Liver Function Tests within 1 month. Should be fasting for these (nothing to eat or drink after midnight except for water prior to testing).    If you have labs (blood work) drawn today and your tests are completely normal, you will receive your results only by: MyChart Message (if you have MyChart) OR A paper copy in the mail If you have any lab test that is abnormal or we need to change your treatment, we will call you to review the results.  Follow-Up: At [facility], you and your health needs are our priority.  As part of our continuing mission to provide you with exceptional heart care, our providers are all part of one team.  This team includes your primary Cardiologist (physician) and Advanced Practice Providers or APPs (Physician Assistants and Nurse Practitioners) who all work together to provide you with the care you need, when you need it.  Your next appointment:   6 month(s)  Provider:   You may see Vishnu P Mallipeddi, MD or one of the following Advanced Practice Providers on your designated Care Team:   [place], PA-C  [place], [place] Theotis Flake, [place]     We recommend signing up for the patient portal called "MyChart".  Sign up information is provided on this After Visit Summary.  MyChart is used to connect with patients for Virtual Visits (Telemedicine).  Patients are able to view lab/test results, encounter notes, upcoming appointments, etc.  Non-urgent messages can be sent to your provider as well.   To learn more about what you can do with MyChart, go to [email or web address].

## 2023-08-19 ENCOUNTER — Other Ambulatory Visit (HOSPITAL_COMMUNITY): Payer: Self-pay | Admitting: Family Medicine

## 2023-08-19 DIAGNOSIS — I7121 Aneurysm of the ascending aorta, without rupture: Secondary | ICD-10-CM | POA: Diagnosis not present

## 2023-08-19 DIAGNOSIS — Z79899 Other long term (current) drug therapy: Secondary | ICD-10-CM | POA: Diagnosis not present

## 2023-08-19 DIAGNOSIS — R42 Dizziness and giddiness: Secondary | ICD-10-CM | POA: Diagnosis not present

## 2023-08-19 DIAGNOSIS — Z86718 Personal history of other venous thrombosis and embolism: Secondary | ICD-10-CM | POA: Diagnosis not present

## 2023-08-19 DIAGNOSIS — Z87891 Personal history of nicotine dependence: Secondary | ICD-10-CM | POA: Diagnosis not present

## 2023-08-19 DIAGNOSIS — R0609 Other forms of dyspnea: Secondary | ICD-10-CM | POA: Diagnosis not present

## 2023-08-19 DIAGNOSIS — R748 Abnormal levels of other serum enzymes: Secondary | ICD-10-CM

## 2023-08-19 DIAGNOSIS — I252 Old myocardial infarction: Secondary | ICD-10-CM | POA: Diagnosis not present

## 2023-08-19 DIAGNOSIS — R110 Nausea: Secondary | ICD-10-CM | POA: Diagnosis not present

## 2023-08-19 DIAGNOSIS — I2510 Atherosclerotic heart disease of native coronary artery without angina pectoris: Secondary | ICD-10-CM | POA: Diagnosis not present

## 2023-09-01 ENCOUNTER — Other Ambulatory Visit: Payer: Self-pay | Admitting: Student

## 2023-09-03 ENCOUNTER — Other Ambulatory Visit: Payer: Self-pay

## 2023-09-03 MED ORDER — PRASUGREL HCL 10 MG PO TABS: 10.0000 mg | ORAL_TABLET | Freq: Every day | ORAL | 2 refills | Status: DC

## 2023-09-07 ENCOUNTER — Ambulatory Visit (HOSPITAL_COMMUNITY)
Admission: RE | Admit: 2023-09-07 | Discharge: 2023-09-07 | Disposition: A | Discharge status: Home / Self Care | Source: Ambulatory Visit | Attending: Family Medicine | Admitting: Family Medicine

## 2023-09-07 DIAGNOSIS — R748 Abnormal levels of other serum enzymes: Secondary | ICD-10-CM | POA: Insufficient documentation

## 2023-09-07 DIAGNOSIS — N400 Enlarged prostate without lower urinary tract symptoms: Secondary | ICD-10-CM | POA: Diagnosis not present

## 2023-09-07 DIAGNOSIS — K5730 Diverticulosis of large intestine without perforation or abscess without bleeding: Secondary | ICD-10-CM | POA: Diagnosis not present

## 2023-09-16 ENCOUNTER — Other Ambulatory Visit: Payer: Self-pay

## 2023-09-16 MED ORDER — METOPROLOL TARTRATE 25 MG PO TABS: 25.0000 mg | ORAL_TABLET | Freq: Two times a day (BID) | ORAL | 1 refills | Status: AC

## 2023-09-16 MED ORDER — ATORVASTATIN CALCIUM 40 MG PO TABS: 40.0000 mg | ORAL_TABLET | Freq: Every day | ORAL | 1 refills | Status: AC

## 2023-09-21 ENCOUNTER — Other Ambulatory Visit (HOSPITAL_COMMUNITY): Payer: Self-pay | Admitting: Family Medicine

## 2023-09-21 DIAGNOSIS — N289 Disorder of kidney and ureter, unspecified: Secondary | ICD-10-CM

## 2023-09-29 ENCOUNTER — Ambulatory Visit (HOSPITAL_COMMUNITY)
Admission: RE | Admit: 2023-09-29 | Discharge: 2023-09-29 | Disposition: A | Discharge status: Home / Self Care | Source: Ambulatory Visit | Attending: Family Medicine | Admitting: Family Medicine

## 2023-09-29 DIAGNOSIS — N281 Cyst of kidney, acquired: Secondary | ICD-10-CM | POA: Diagnosis not present

## 2023-09-29 DIAGNOSIS — N289 Disorder of kidney and ureter, unspecified: Secondary | ICD-10-CM | POA: Insufficient documentation

## 2023-12-18 ENCOUNTER — Emergency Department (HOSPITAL_COMMUNITY): Admission: EM | Admit: 2023-12-18 | Discharge: 2023-12-18 | Disposition: A | Discharge status: Home / Self Care

## 2023-12-18 ENCOUNTER — Emergency Department (HOSPITAL_COMMUNITY)

## 2023-12-18 ENCOUNTER — Other Ambulatory Visit: Payer: Self-pay

## 2023-12-18 ENCOUNTER — Encounter (HOSPITAL_COMMUNITY): Payer: Self-pay | Admitting: *Deleted

## 2023-12-18 DIAGNOSIS — Z7982 Long term (current) use of aspirin: Secondary | ICD-10-CM | POA: Insufficient documentation

## 2023-12-18 DIAGNOSIS — I10 Essential (primary) hypertension: Secondary | ICD-10-CM | POA: Diagnosis not present

## 2023-12-18 DIAGNOSIS — M25532 Pain in left wrist: Secondary | ICD-10-CM | POA: Diagnosis not present

## 2023-12-18 NOTE — ED Provider Notes (Signed)
  EMERGENCY DEPARTMENT AT [facility] Provider Note   CSN: [id number] Arrival date & time: 12/18/23  1257     Patient presents with: Wrist Pain   James Harris is a 68 y.o. male.   68 year old male presents for evaluation of left wrist pain.  States he woke up this morning with pain.  Denies hitting it.  Denies any falls or injuries.  States he is able to move it and he has not noticed any swelling.  No history of gout.  Denies any other symptoms or concerns.   Wrist Pain Pertinent negatives include no chest pain, no abdominal pain and no shortness of breath.       Prior to Admission medications   Medication Sig Start Date End Date Taking? Authorizing Provider  albuterol  (PROVENTIL ) (2.5 MG/3ML) 0.083% nebulizer solution Take 3 mLs (2.5 mg total) by nebulization every 6 (six) hours as needed for wheezing or shortness of breath. Patient not taking: Reported on 05/01/2023 05/24/19   Elnor Lauraine BRAVO, NP  albuterol  (VENTOLIN  HFA) 108 [phone] Base) MCG/ACT inhaler Inhale 2 puffs into the lungs every 6 (six) hours as needed for wheezing or shortness of breath.    [provider]  aspirin  EC 81 MG tablet Take 1 tablet (81 mg total) by mouth daily. Swallow whole. 02/12/23   Goodrich, Callie E, PA-C  atorvastatin  (LIPITOR) 40 MG tablet Take 1 tablet (40 mg total) by mouth daily. 09/16/23   Mallipeddi, Vishnu P, MD  Cholecalciferol  (VITAMIN D -3) 125 MCG (5000 UT) TABS Take 2 tablets by mouth daily.    [provider]  escitalopram  (LEXAPRO ) 10 MG tablet Take 1 tablet (10 mg total) by mouth daily. 05/10/20   Elnor Lauraine BRAVO, NP  losartan  (COZAAR ) 25 MG tablet Take 1 tablet (25 mg total) by mouth daily. 02/12/23   Goodrich, Callie E, PA-C  meclizine (ANTIVERT) 25 MG tablet Take 25 mg by mouth daily as needed for dizziness.    [provider]  metoprolol  tartrate (LOPRESSOR ) 25 MG tablet Take 1 tablet (25 mg total) by mouth 2 (two) times daily. 09/16/23   Mallipeddi,  Vishnu P, MD  Multiple Vitamin (MULTIVITAMIN WITH MINERALS) TABS tablet Take 1 tablet by mouth daily.    [provider]  nitroGLYCERIN  (NITROSTAT ) 0.4 MG SL tablet PLACE 1 TABLETS UNDER THE TONGUE EVERY 5 MINUTES FR 3 DOSES AS NEEDED FOR CHEST PAIN 02/23/23   Goodrich, Callie E, PA-C  pantoprazole  (PROTONIX ) 40 MG tablet TAKE 30-60 MINUTES BEFORE YOUR FIRST MEAL AND LAST MEAL OF THE DAY Patient taking differently: Take 40 mg by mouth daily. 05/18/20   Darlean Ozell NOVAK, MD  prasugrel  (EFFIENT ) 10 MG TABS tablet Take 1 tablet (10 mg total) by mouth daily. 09/03/23   Mallipeddi, Vishnu P, MD  predniSONE  (DELTASONE ) 20 MG tablet Take 20 mg by mouth daily as needed (for breathing). Patient not taking: Reported on 05/01/2023 01/22/20   [provider]    Allergies: Symbicort  [budesonide -formoterol  fumarate]    Review of Systems  Constitutional:  Negative for chills and fever.  HENT:  Negative for ear pain and sore throat.   Eyes:  Negative for pain and visual disturbance.  Respiratory:  Negative for cough and shortness of breath.   Cardiovascular:  Negative for chest pain and palpitations.  Gastrointestinal:  Negative for abdominal pain and vomiting.  Genitourinary:  Negative for dysuria and hematuria.  Musculoskeletal:  Negative for arthralgias and back pain.  Wrist pain  Skin:  Negative for color change and rash.  Neurological:  Negative for seizures and syncope.  All other systems reviewed and are negative.   Updated Vital Signs BP (!) 131/96   Pulse (!) 54   Temp 97.7 F (36.5 C) (Oral)   Resp 17   Ht 6' (1.829 m)   Wt 97.1 kg   SpO2 97%   BMI 29.02 kg/m   Physical Exam Vitals and nursing note reviewed.  Constitutional:      General: He is not in acute distress.    Appearance: He is well-developed.  HENT:     Head: Normocephalic and atraumatic.  Eyes:     Conjunctiva/sclera: Conjunctivae normal.  Cardiovascular:     Rate and Rhythm: Normal rate and  regular rhythm.     Heart sounds: No murmur heard. Pulmonary:     Effort: Pulmonary effort is normal. No respiratory distress.     Breath sounds: Normal breath sounds.  Abdominal:     Palpations: Abdomen is soft.     Tenderness: There is no abdominal tenderness.  Musculoskeletal:        General: No swelling or tenderness.     Cervical back: Neck supple.     Comments: There is no real tenderness to palpation, patient does have some pain with range of motion, there is no swelling or erythema, neurovascularly intact, 2+ radial pulses  Skin:    General: Skin is warm and dry.     Capillary Refill: Capillary refill takes less than 2 seconds.  Neurological:     Mental Status: He is alert.  Psychiatric:        Mood and Affect: Mood normal.     (all labs ordered are listed, but only abnormal results are displayed) Labs Reviewed - No data to display  EKG: EKG Interpretation Date/Time:  Friday December 18 2023 13:23:35 EST Ventricular Rate:  56 PR Interval:  173 QRS Duration:  100 QT Interval:  445 QTC Calculation: 430 R Axis:   27  Text Interpretation: Sinus rhythm Abnormal R-wave progression, early transition Baseline wander in lead(s) V3 Compared with prior EKG from 02/12/2023 Confirmed by Gennaro Bouchard ([id number]) on 12/18/2023 1:26:31 PM  Radiology: [facility] Wrist Complete Left Result Date: 12/18/2023 EXAM: 3 or more view(s) Xray of the left wrist 12/18/2023 01:15:28 PM COMPARISON: None available. CLINICAL HISTORY: Pain FINDINGS: BONES AND JOINTS: No acute fracture. No malalignment. SOFT TISSUES: The soft tissues are unremarkable. IMPRESSION: 1. No significant abnormality. Electronically signed by: Lynwood Seip MD 12/18/2023 01:30 PM EST RP Workstation: [id number]     Procedures   Medications Ordered in the ED - No data to display                                  Medical Decision Making Patient here for nontraumatic left wrist pain that started today.  Has a fairly unremarkable  physical exam and negative imaging.  Will put him in a wrist splint and he is advised Tylenol  as needed for pain follow-up with primary care in 1 to 2 weeks.  Advised return for new or worsening symptoms.  He feels comfortable being discharged home.  All results and plan discussed with patient and wife at bedside.  Problems Addressed: Left wrist pain: acute illness or injury  Amount and/or Complexity of Data Reviewed External Data Reviewed: notes.    Details: Prior hospital records reviewed and patient admitted in  02/02/2023 for NSTEMI Radiology: ordered and independent interpretation performed. Decision-making details documented in ED Course.    Details: Ordered and inter by me independently of radiology Left wrist x-ray: Shows no acute abnormality  Risk OTC drugs. Prescription drug management.     Final diagnoses:  Left wrist pain    ED Discharge Orders     None          Gennaro Duwaine CROME, DO 12/18/23 1356

## 2023-12-18 NOTE — Discharge Instructions (Addendum)
 You should use ice and tylenol  as needed for pain. Wear your wrist brace throughout the day for support and pain control, and especially wear it at night while you're sleeping. Follow up with your primary care doctor in 1-2 weeks.

## 2023-12-18 NOTE — ED Notes (Signed)
 Provider was notified EKG was completed and uploaded in the chart

## 2023-12-18 NOTE — ED Triage Notes (Signed)
 Pt with left wrist pain for past few days.  Denies any injury. + diarrhea, denies abd pain, +mild nausea

## 2023-12-18 NOTE — ED Notes (Signed)
 XL wrist brace to left wrist at this time. Pt tolerated well

## 2023-12-18 NOTE — ED Notes (Signed)
 Pt/family received d/c paperwork at this time. After going over the paperwork any questions, comments, or concerns were answered to the best of this nurse's knowledge. The pt/family verbally acknowledged the teachings/instructions.

## 2023-12-22 DIAGNOSIS — Z125 Encounter for screening for malignant neoplasm of prostate: Secondary | ICD-10-CM | POA: Diagnosis not present

## 2024-02-08 ENCOUNTER — Ambulatory Visit: Attending: Physician Assistant | Admitting: Physician Assistant

## 2024-02-16 ENCOUNTER — Encounter: Payer: Self-pay | Admitting: Physician Assistant

## 2024-02-16 ENCOUNTER — Ambulatory Visit: Admitting: Physician Assistant

## 2024-02-16 VITALS — BP 118/60 | HR 70 | Ht 72.0 in | Wt 219.8 lb

## 2024-02-16 DIAGNOSIS — I2510 Atherosclerotic heart disease of native coronary artery without angina pectoris: Secondary | ICD-10-CM

## 2024-02-16 DIAGNOSIS — R55 Syncope and collapse: Secondary | ICD-10-CM | POA: Diagnosis not present

## 2024-02-16 DIAGNOSIS — Z86718 Personal history of other venous thrombosis and embolism: Secondary | ICD-10-CM

## 2024-02-16 DIAGNOSIS — E785 Hyperlipidemia, unspecified: Secondary | ICD-10-CM

## 2024-02-16 DIAGNOSIS — R42 Dizziness and giddiness: Secondary | ICD-10-CM

## 2024-02-16 DIAGNOSIS — I7121 Aneurysm of the ascending aorta, without rupture: Secondary | ICD-10-CM

## 2024-02-16 DIAGNOSIS — I252 Old myocardial infarction: Secondary | ICD-10-CM | POA: Diagnosis not present

## 2024-02-16 MED ORDER — LOSARTAN POTASSIUM 25 MG PO TABS: 12.5000 mg | ORAL_TABLET | Freq: Every day | ORAL | 3 refills | Status: AC

## 2024-02-16 MED ORDER — CLOPIDOGREL BISULFATE 75 MG PO TABS: 75.0000 mg | ORAL_TABLET | Freq: Every day | ORAL | 3 refills | Status: AC

## 2024-02-16 NOTE — Patient Instructions (Signed)
 Medication Instructions:  Your physician has recommended you make the following change in your medication:   -Decrease Losartan  to 12.5 mg once daily   *If you need a refill on your cardiac medications before your next appointment, please call your pharmacy*  Lab Work: NOne If you have labs (blood work) drawn today and your tests are completely normal, you will receive your results only by: MyChart Message (if you have MyChart) OR A paper copy in the mail If you have any lab test that is abnormal or we need to change your treatment, we will call you to review the results.  Testing/Procedures: None  Follow-Up: At [facility], you and your health needs are our priority.  As part of our continuing mission to provide you with exceptional heart care, our providers are all part of one team.  This team includes your primary Cardiologist (physician) and Advanced Practice Providers or APPs (Physician Assistants and Nurse Practitioners) who all work together to provide you with the care you need, when you need it.  Your next appointment:   3 month(s)  Provider:   You may see Vishnu P Mallipeddi, MD or one of the following Advanced Practice Providers on your designated Care Team:   Brittany Strader, PA-C  [place], [place] Olivia [place], [place]     We recommend signing up for the patient portal called MyChart.  Sign up information is provided on this After Visit Summary.  MyChart is used to connect with patients for Virtual Visits (Telemedicine).  Patients are able to view lab/test results, encounter notes, upcoming appointments, etc.  Non-urgent messages can be sent to your provider as well.   To learn more about what you can do with MyChart, go to [email or web address].   Other Instructions Thank you for choosing [facility]!

## 2024-05-17 ENCOUNTER — Ambulatory Visit: Admitting: Internal Medicine
